# Patient Record
Sex: Female | Born: 1955 | Race: Black or African American | Hispanic: No | Smoking: Former smoker
Health system: Southern US, Community
[De-identification: ages and names within clinical notes are randomized; demographics above are authoritative.]

## PROBLEM LIST (undated history)

## (undated) ENCOUNTER — Ambulatory Visit: Admission: EM

## (undated) DIAGNOSIS — E785 Hyperlipidemia, unspecified: Secondary | ICD-10-CM

## (undated) DIAGNOSIS — K219 Gastro-esophageal reflux disease without esophagitis: Secondary | ICD-10-CM

## (undated) DIAGNOSIS — I10 Essential (primary) hypertension: Secondary | ICD-10-CM

## (undated) HISTORY — DX: Hyperlipidemia, unspecified: E78.5

---
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
## 1998-06-21 ENCOUNTER — Emergency Department (HOSPITAL_COMMUNITY): Admission: EM | Admit: 1998-06-21 | Discharge: 1998-06-22 | Payer: Self-pay | Admitting: Endocrinology

## 1998-06-24 ENCOUNTER — Ambulatory Visit (HOSPITAL_COMMUNITY): Admission: RE | Admit: 1998-06-24 | Discharge: 1998-06-24 | Payer: Self-pay | Admitting: Orthopedic Surgery

## 1999-01-29 ENCOUNTER — Encounter: Admission: RE | Admit: 1999-01-29 | Discharge: 1999-01-29 | Payer: Self-pay | Admitting: Family Medicine

## 2000-02-22 ENCOUNTER — Encounter: Admission: RE | Admit: 2000-02-22 | Discharge: 2000-02-22 | Payer: Self-pay | Admitting: Family Medicine

## 2000-03-07 ENCOUNTER — Encounter: Admission: RE | Admit: 2000-03-07 | Discharge: 2000-03-07 | Payer: Self-pay | Admitting: Family Medicine

## 2000-03-14 ENCOUNTER — Encounter: Admission: RE | Admit: 2000-03-14 | Discharge: 2000-03-14 | Payer: Self-pay | Admitting: Family Medicine

## 2000-04-29 ENCOUNTER — Encounter: Admission: RE | Admit: 2000-04-29 | Discharge: 2000-04-29 | Payer: Self-pay | Admitting: Family Medicine

## 2000-08-09 ENCOUNTER — Encounter: Payer: Self-pay | Admitting: Emergency Medicine

## 2000-08-09 ENCOUNTER — Emergency Department (HOSPITAL_COMMUNITY): Admission: EM | Admit: 2000-08-09 | Discharge: 2000-08-10 | Payer: Self-pay | Admitting: Emergency Medicine

## 2002-10-23 ENCOUNTER — Emergency Department (HOSPITAL_COMMUNITY): Admission: EM | Admit: 2002-10-23 | Discharge: 2002-10-23 | Payer: Self-pay | Admitting: Emergency Medicine

## 2002-10-23 ENCOUNTER — Encounter: Payer: Self-pay | Admitting: Emergency Medicine

## 2002-10-24 ENCOUNTER — Encounter: Admission: RE | Admit: 2002-10-24 | Discharge: 2002-10-24 | Payer: Self-pay | Admitting: Sports Medicine

## 2002-10-26 ENCOUNTER — Encounter: Admission: RE | Admit: 2002-10-26 | Discharge: 2002-10-26 | Payer: Self-pay | Admitting: Sports Medicine

## 2002-10-26 ENCOUNTER — Encounter: Payer: Self-pay | Admitting: Sports Medicine

## 2003-01-19 DIAGNOSIS — K219 Gastro-esophageal reflux disease without esophagitis: Secondary | ICD-10-CM

## 2003-01-19 HISTORY — DX: Gastro-esophageal reflux disease without esophagitis: K21.9

## 2003-01-28 ENCOUNTER — Encounter: Admission: RE | Admit: 2003-01-28 | Discharge: 2003-01-28 | Payer: Self-pay | Admitting: Family Medicine

## 2003-01-30 ENCOUNTER — Encounter: Admission: RE | Admit: 2003-01-30 | Discharge: 2003-01-30 | Payer: Self-pay | Admitting: Family Medicine

## 2003-05-13 ENCOUNTER — Emergency Department (HOSPITAL_COMMUNITY): Admission: EM | Admit: 2003-05-13 | Discharge: 2003-05-14 | Payer: Self-pay | Admitting: Emergency Medicine

## 2004-01-04 ENCOUNTER — Emergency Department (HOSPITAL_COMMUNITY): Admission: EM | Admit: 2004-01-04 | Discharge: 2004-01-05 | Payer: Self-pay | Admitting: Emergency Medicine

## 2004-05-23 ENCOUNTER — Encounter (INDEPENDENT_AMBULATORY_CARE_PROVIDER_SITE_OTHER): Payer: Self-pay | Admitting: *Deleted

## 2004-06-09 ENCOUNTER — Ambulatory Visit: Payer: Self-pay | Admitting: Family Medicine

## 2004-06-16 ENCOUNTER — Encounter: Admission: RE | Admit: 2004-06-16 | Discharge: 2004-06-16 | Payer: Self-pay | Admitting: Sports Medicine

## 2004-07-24 ENCOUNTER — Ambulatory Visit: Payer: Self-pay | Admitting: Family Medicine

## 2004-08-06 ENCOUNTER — Encounter: Admission: RE | Admit: 2004-08-06 | Discharge: 2004-08-06 | Payer: Self-pay | Admitting: Sports Medicine

## 2004-09-23 ENCOUNTER — Ambulatory Visit: Payer: Self-pay | Admitting: Family Medicine

## 2004-09-25 ENCOUNTER — Ambulatory Visit: Payer: Self-pay | Admitting: Family Medicine

## 2006-03-17 DIAGNOSIS — N951 Menopausal and female climacteric states: Secondary | ICD-10-CM | POA: Insufficient documentation

## 2006-03-17 DIAGNOSIS — E669 Obesity, unspecified: Secondary | ICD-10-CM

## 2006-03-17 DIAGNOSIS — K219 Gastro-esophageal reflux disease without esophagitis: Secondary | ICD-10-CM | POA: Insufficient documentation

## 2006-03-18 ENCOUNTER — Encounter (INDEPENDENT_AMBULATORY_CARE_PROVIDER_SITE_OTHER): Payer: Self-pay | Admitting: *Deleted

## 2006-06-06 ENCOUNTER — Emergency Department (HOSPITAL_COMMUNITY): Admission: EM | Admit: 2006-06-06 | Discharge: 2006-06-06 | Payer: Self-pay | Admitting: Family Medicine

## 2006-08-09 ENCOUNTER — Encounter (INDEPENDENT_AMBULATORY_CARE_PROVIDER_SITE_OTHER): Payer: Self-pay | Admitting: Family Medicine

## 2006-08-09 ENCOUNTER — Ambulatory Visit: Payer: Self-pay | Admitting: Family Medicine

## 2006-08-09 DIAGNOSIS — I10 Essential (primary) hypertension: Secondary | ICD-10-CM | POA: Insufficient documentation

## 2006-08-09 DIAGNOSIS — R42 Dizziness and giddiness: Secondary | ICD-10-CM

## 2006-08-10 ENCOUNTER — Encounter (INDEPENDENT_AMBULATORY_CARE_PROVIDER_SITE_OTHER): Payer: Self-pay | Admitting: Family Medicine

## 2006-08-12 ENCOUNTER — Encounter (INDEPENDENT_AMBULATORY_CARE_PROVIDER_SITE_OTHER): Payer: Self-pay | Admitting: Family Medicine

## 2007-03-18 ENCOUNTER — Emergency Department (HOSPITAL_COMMUNITY): Admission: EM | Admit: 2007-03-18 | Discharge: 2007-03-18 | Payer: Self-pay | Admitting: Family Medicine

## 2007-04-24 ENCOUNTER — Telehealth (INDEPENDENT_AMBULATORY_CARE_PROVIDER_SITE_OTHER): Payer: Self-pay | Admitting: Family Medicine

## 2007-05-05 ENCOUNTER — Ambulatory Visit: Payer: Self-pay | Admitting: Family Medicine

## 2007-12-27 ENCOUNTER — Emergency Department (HOSPITAL_COMMUNITY): Admission: EM | Admit: 2007-12-27 | Discharge: 2007-12-27 | Payer: Self-pay | Admitting: Emergency Medicine

## 2008-01-08 ENCOUNTER — Emergency Department (HOSPITAL_COMMUNITY): Admission: EM | Admit: 2008-01-08 | Discharge: 2008-01-08 | Payer: Self-pay | Admitting: Family Medicine

## 2008-02-25 ENCOUNTER — Emergency Department (HOSPITAL_COMMUNITY): Admission: EM | Admit: 2008-02-25 | Discharge: 2008-02-25 | Payer: Self-pay | Admitting: Emergency Medicine

## 2009-02-01 ENCOUNTER — Emergency Department (HOSPITAL_COMMUNITY): Admission: EM | Admit: 2009-02-01 | Discharge: 2009-02-01 | Payer: Self-pay | Admitting: Emergency Medicine

## 2009-03-21 ENCOUNTER — Telehealth: Payer: Self-pay | Admitting: Family Medicine

## 2009-08-25 ENCOUNTER — Ambulatory Visit: Payer: Self-pay | Admitting: Family Medicine

## 2009-08-25 ENCOUNTER — Encounter: Payer: Self-pay | Admitting: Family Medicine

## 2009-08-25 DIAGNOSIS — A5901 Trichomonal vulvovaginitis: Secondary | ICD-10-CM

## 2009-08-25 DIAGNOSIS — N926 Irregular menstruation, unspecified: Secondary | ICD-10-CM | POA: Insufficient documentation

## 2009-08-25 DIAGNOSIS — N760 Acute vaginitis: Secondary | ICD-10-CM | POA: Insufficient documentation

## 2009-08-25 DIAGNOSIS — N939 Abnormal uterine and vaginal bleeding, unspecified: Secondary | ICD-10-CM

## 2009-08-25 DIAGNOSIS — R350 Frequency of micturition: Secondary | ICD-10-CM

## 2009-08-25 LAB — CONVERTED CEMR LAB
ALT: 13 units/L (ref 0–35)
AST: 14 units/L (ref 0–37)
Alkaline Phosphatase: 102 units/L (ref 39–117)
BUN: 16 mg/dL (ref 6–23)
Bilirubin Urine: NEGATIVE
Calcium: 9.5 mg/dL (ref 8.4–10.5)
Creatinine, Ser: 0.88 mg/dL (ref 0.40–1.20)
GC Probe Amp, Genital: NEGATIVE
Glucose, Urine, Semiquant: NEGATIVE
HCT: 37.6 % (ref 36.0–46.0)
Hemoglobin: 12.3 g/dL (ref 12.0–15.0)
MCHC: 32.7 g/dL (ref 30.0–36.0)
MCV: 78.7 fL (ref 78.0–100.0)
Platelets: 353 10*3/uL (ref 150–400)
RDW: 15.1 % (ref 11.5–15.5)
Total Bilirubin: 0.3 mg/dL (ref 0.3–1.2)
Urobilinogen, UA: 0.2
pH: 5.5

## 2009-10-07 ENCOUNTER — Telehealth: Payer: Self-pay | Admitting: Family Medicine

## 2009-12-31 ENCOUNTER — Ambulatory Visit: Payer: Self-pay | Admitting: Family Medicine

## 2009-12-31 DIAGNOSIS — R609 Edema, unspecified: Secondary | ICD-10-CM | POA: Insufficient documentation

## 2009-12-31 DIAGNOSIS — G4730 Sleep apnea, unspecified: Secondary | ICD-10-CM

## 2010-01-07 ENCOUNTER — Ambulatory Visit: Payer: Self-pay

## 2010-01-18 DIAGNOSIS — I10 Essential (primary) hypertension: Secondary | ICD-10-CM

## 2010-01-18 DIAGNOSIS — E785 Hyperlipidemia, unspecified: Secondary | ICD-10-CM

## 2010-01-18 HISTORY — DX: Hyperlipidemia, unspecified: E78.5

## 2010-01-18 HISTORY — DX: Essential (primary) hypertension: I10

## 2010-01-28 ENCOUNTER — Ambulatory Visit: Admit: 2010-01-28 | Payer: Self-pay

## 2010-02-17 NOTE — Assessment & Plan Note (Signed)
Summary: female problem,tcb   Vital Signs:  Patient profile:   55 year old female Height:      67 inches Weight:      281 pounds BMI:     44.17 Pulse rate:   60 / minute BP sitting:   167 / 81  (left arm) Cuff size:   large  Vitals Entered By: Arlyss Repress CMA, (August 25, 2009 2:44 PM) CC: vaginal d/c x 3 weeks. had unprotected sex one month ago. Is Patient Diabetic? No Pain Assessment Patient in pain? no        CC:  vaginal d/c x 3 weeks. had unprotected sex one month ago.Marland Kitchen  History of Present Illness: Unprotected sexual intercourse one month ago, 3 weeks developed a brown vaginal discharge, and irritiation.  She called her partner to tell him and he stopped contact with her.  She is very frightened and fearful that there is more to the story.  She did not refuse HIV testing but wanted to not have it done because of fear. Occasional drinks beer, had 2 beers 2 days ago, denies illicit drugs.  She rarely come into the clinic, has been out of her BP meds.    Habits & Providers  Alcohol-Tobacco-Diet     Tobacco Status: quit  Current Medications (verified): 1)  Hydrochlorothiazide 25 Mg  Tabs (Hydrochlorothiazide) .... Take 1 Tab By Mouth Every Morning 2)  Metronidazole 500 Mg Tabs (Metronidazole) .... 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  Is dating a man steadily.  Social History: Smoking Status:  quit  Review of Systems      See HPI General:  Denies fever and  sweats. GU:  Complains of abnormal vaginal bleeding, discharge, dysuria, and urinary frequency; denies pelvic pain.  Physical Exam  General:  Obese, 55 year old AA female Genitalia:  normal introitus, thick copius yellow discharge, inflammed vaginal wall and cervix.  No CMT.  Wet mount + trich   Impression & Recommendations:  Problem # 1:  VAGINAL TRICHOMONIASIS (ICD-131.01) Complete STD testing with HIV adn GC/CT.  She was very upset and almost could not have blood work completed.  Metronidazole 2 GM dose, will call in the AM.  Problem # 2:  HYPERTENSION, BENIGN ESSENTIAL (ICD-401.1) labs today, return in 2 weeks to discuss medical problems has not been in for a few years. The following medications were removed from the medication list:    Hydrochlorothiazide 25 Mg Tabs (Hydrochlorothiazide) .Marland Kitchen... Take 1 tab by mouth every morning  Orders: Comp Met-[facility] ([id number])  Complete Medication List: 1)  Metronidazole 500 Mg Tabs (Metronidazole) .... 4 pills at one time  Other Orders: Wet Prep- [facility] [phone]) Urinalysis-[facility] (00000) CBC-[facility] (13086) TSH-[facility] [phone]) GC/Chlamydia-[facility] (87591/[id number]) HIV-[facility] (28413-24401) [facility]- Est  Level 4 (02725)  Patient Instructions: 1)  Please schedule a follow-up appointment in 2 weeks.  2)  [phone]-call tomorrow Prescriptions: METRONIDAZOLE 500 MG TABS (METRONIDAZOLE) 4 pills at one time  #4 x 0   Entered and Authorized by:  Luretha Murphy NP   Signed by:   Arlyss Repress CMA, on 08/25/2009   Method used:   Electronically to        [id number]* (retail)       [address] [place], [place]  [address]       Ph: [phone]       Fax: [phone]   RxID:   [id number]   Laboratory Results   Urine Tests  Date/Time Received: August 25, 2009 3:04 PM  Date/Time Reported: August 25, 2009 3:12 PM   Routine Urinalysis   Color: yellow Appearance: Clear Glucose: negative   (Normal Range: Negative) Bilirubin:  negative   (Normal Range: Negative) Ketone: negative   (Normal Range: Negative) Spec. Gravity: 1.015   (Normal Range: 1.003-1.035) Blood: small   (Normal Range: Negative) pH: 5.5   (Normal Range: 5.0-8.0) Protein: trace   (Normal Range: Negative) Urobilinogen: 0.2   (Normal Range: 0-1) Nitrite: negative   (Normal Range: Negative) Leukocyte Esterace: moderate   (Normal Range: Negative)  Urine Microscopic WBC/HPF: 5-10 RBC/HPF: 0-3 Bacteria/HPF: 1+ Epithelial/HPF: 1-5 Other: few trich    Comments: ...............test performed by......Marland KitchenBonnie A. [place], MLS (ASCP)cm  Date/Time Received: August 25, 2009 3:04 PM  Date/Time Reported: August 25, 2009 3:33 PM   [employer] Source: vag WBC/hpf: 10-20 Bacteria/hpf: 3+  Cocci Clue cells/hpf: none  Positive whiff Yeast/hpf: none Trichomonas/hpf: many Comments: ...............test performed by......Marland KitchenBonnie A. [place], MLS (ASCP)cm

## 2010-02-17 NOTE — Progress Notes (Signed)
Summary: resch  Phone Note Call from Patient   Caller: Patient Summary of Call: pt called to say she couldn't get off work today so she had to resch Initial call taken by: De Nurse,  March 21, 2009 1:45 PM

## 2010-02-17 NOTE — Progress Notes (Signed)
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

## 2010-02-18 ENCOUNTER — Inpatient Hospital Stay (HOSPITAL_COMMUNITY)
Admission: RE | Admit: 2010-02-18 | Discharge: 2010-02-18 | Disposition: A | Discharge status: Home / Self Care | Payer: Self-pay | Source: Ambulatory Visit | Attending: Emergency Medicine | Admitting: Emergency Medicine

## 2010-02-18 DIAGNOSIS — K047 Periapical abscess without sinus: Secondary | ICD-10-CM

## 2010-02-19 NOTE — Assessment & Plan Note (Signed)
Summary: retaining fluid/eo   Vital Signs:  Patient profile:   55 year old female Height:      67 inches Weight:      305 pounds BMI:     47.94 BSA:     2.42 Temp:     97.8 degrees F Pulse rate:   82 / minute BP sitting:   167 / 90  Vitals Entered By: Jone Baseman CMA (December 31, 2009 11:17 AM) CC: retaining fluid x 2 weeks Is Patient Diabetic? No Pain Assessment Patient in pain? no        Primary Care Provider:  . [facility]  CC:  retaining fluid x 2 weeks.  History of Present Illness: edema in legs, gets better with elevation.  off and on for years, worse in last 2 weeks.  no SOB, walking 3 miles daily, no CP.  does wake up at night with SOB but known to snore, has witnessed sleep aopnea.    weight gain since last visit.  was losing weight before wedding but since has separated and lost motivation.  recently restarted.  Habits & Providers  Alcohol-Tobacco-Diet     Tobacco Status: quit  Current Medications (verified): 1)  Hydrochlorothiazide 25 Mg Tabs (Hydrochlorothiazide) .... Take One By Mouth Q Day  Allergies (verified): No Known Drug Allergies  Review of Systems  The patient denies anorexia, fever, chest pain, syncope, dyspnea on exertion, prolonged cough, and abdominal pain.    Physical Exam  General:  VS reviewed.  obese Lungs:  Normal respiratory effort, chest expands symmetrically. Lungs are clear to auscultation, no crackles or wheezes. Heart:  Normal rate and regular rhythm. S1 and S2 normal without gallop, murmur, click, rub or other extra sounds. Abdomen:  Bowel sounds positive,abdomen soft and non-tender without masses, organomegaly or hernias noted. Extremities:  trace edema to shins   Impression & Recommendations:  Problem # 1:  HYPERTENSION, BENIGN ESSENTIAL (ICD-401.1) Assessment Deteriorated willl restart HCTZ. RN visit 1 week for labs, recheck BP Her updated medication list for this problem includes:    Hydrochlorothiazide 25  Mg Tabs (Hydrochlorothiazide) .Marland Kitchen... Take one by mouth q day  Orders: [facility]- Est  Level 4 ([address]214)Future Orders: Basic Met-[facility] ([id number]) ... 12/30/2010  Problem # 2:  SLEEP APNEA (ICD-780.57) Assessment: New sleep apnea.  likely needs CPAP.  send for sleep study Orders: [facility]- Est  Level 4 (99214)Future Orders: Diagnostic Polysomnogram (Diagnostic Polysomno) ... 12/23/2010  Problem # 3:  LEG EDEMA, BILATERAL (ICD-782.3) Assessment: Deteriorated will start HCTZ.  echo to eval heart function with her worsening leg edema.  will work on losing weight, exercise for her heart.  consider starting ASA in the future.   Her updated medication list for this problem includes:    Hydrochlorothiazide 25 Mg Tabs (Hydrochlorothiazide) .Marland Kitchen... Take one by mouth q day  Orders: [facility]- Est  Level 4 ([address]214)Future Orders: 2 D Echo (2 D Echo) ... 01/08/2011  Complete Medication List: 1)  Hydrochlorothiazide 25 Mg Tabs (Hydrochlorothiazide) .... Take one by mouth q day  Other Orders: Future Orders: Lipid-[facility] ([id number]) ... 01/07/2011  Patient Instructions: 1)  RN visit and Lab appt 1 week 2)  MD visit 1 month 3)  It was nice to meet you today.  4)  Congratulations on getting back into better diet and fitness! 5)  Once you get [facility] qualified, we will do your testing.  I think right now we are doing what we need to do to keep you healthy.   6)  We will start the HCTZ and you will return in one week for a nurse visit for a blood pressure check.  See me in one month 7)  When you come for the nurse visit, you can also go to the lab for a cholesterol check.   Prescriptions: HYDROCHLOROTHIAZIDE 25 MG TABS (HYDROCHLOROTHIAZIDE) take one by mouth q day  #31 x 6   Entered and Authorized by:   Ellery Plunk MD   Signed by:   Ellery Plunk MD on 12/31/2009   Method used:   Electronically to        [facility] [address] #[id number]* (retail)       [address] [place], [place]  [address]       Ph:  [phone]       Fax: [phone]   RxID:   [id number]    Orders Added: 1)  Basic Met[facility] [phone] 2)  Lipid-[facility] [[id number]] 3)  2 D Echo [2 D Echo] 4)  Diagnostic Polysomnogram [Diagnostic Polysomno] 5)  [facility]- Est  Level 4 [34742]

## 2010-05-13 ENCOUNTER — Observation Stay (HOSPITAL_COMMUNITY)
Admission: EM | Admit: 2010-05-13 | Discharge: 2010-05-14 | Disposition: A | Discharge status: Home / Self Care | Payer: Self-pay | Attending: Emergency Medicine | Admitting: Emergency Medicine

## 2010-05-13 ENCOUNTER — Inpatient Hospital Stay (INDEPENDENT_AMBULATORY_CARE_PROVIDER_SITE_OTHER)
Admission: RE | Admit: 2010-05-13 | Discharge: 2010-05-13 | Disposition: A | Discharge status: Home / Self Care | Payer: Self-pay | Source: Ambulatory Visit | Attending: Emergency Medicine | Admitting: Emergency Medicine

## 2010-05-13 DIAGNOSIS — T46905A Adverse effect of unspecified agents primarily affecting the cardiovascular system, initial encounter: Secondary | ICD-10-CM | POA: Insufficient documentation

## 2010-05-13 DIAGNOSIS — T783XXA Angioneurotic edema, initial encounter: Principal | ICD-10-CM | POA: Insufficient documentation

## 2010-05-13 DIAGNOSIS — T50995A Adverse effect of other drugs, medicaments and biological substances, initial encounter: Secondary | ICD-10-CM

## 2010-05-13 DIAGNOSIS — I10 Essential (primary) hypertension: Secondary | ICD-10-CM

## 2010-05-13 DIAGNOSIS — Y92009 Unspecified place in unspecified non-institutional (private) residence as the place of occurrence of the external cause: Secondary | ICD-10-CM | POA: Insufficient documentation

## 2010-08-01 ENCOUNTER — Other Ambulatory Visit: Payer: Self-pay | Admitting: Family Medicine

## 2010-09-27 ENCOUNTER — Other Ambulatory Visit: Payer: Self-pay | Admitting: Family Medicine

## 2010-09-27 DIAGNOSIS — I10 Essential (primary) hypertension: Secondary | ICD-10-CM

## 2010-12-11 ENCOUNTER — Encounter: Payer: Self-pay | Admitting: *Deleted

## 2010-12-11 ENCOUNTER — Emergency Department (HOSPITAL_COMMUNITY)
Admission: EM | Admit: 2010-12-11 | Discharge: 2010-12-11 | Disposition: A | Discharge status: Home / Self Care | Payer: Self-pay | Attending: Emergency Medicine | Admitting: Emergency Medicine

## 2010-12-11 DIAGNOSIS — K219 Gastro-esophageal reflux disease without esophagitis: Secondary | ICD-10-CM | POA: Insufficient documentation

## 2010-12-11 DIAGNOSIS — Z79899 Other long term (current) drug therapy: Secondary | ICD-10-CM | POA: Insufficient documentation

## 2010-12-11 DIAGNOSIS — I10 Essential (primary) hypertension: Secondary | ICD-10-CM | POA: Insufficient documentation

## 2010-12-11 DIAGNOSIS — K089 Disorder of teeth and supporting structures, unspecified: Secondary | ICD-10-CM | POA: Insufficient documentation

## 2010-12-11 DIAGNOSIS — K047 Periapical abscess without sinus: Secondary | ICD-10-CM

## 2010-12-11 HISTORY — DX: Essential (primary) hypertension: I10

## 2010-12-11 HISTORY — DX: Gastro-esophageal reflux disease without esophagitis: K21.9

## 2010-12-11 MED ORDER — PENICILLIN V POTASSIUM 500 MG PO TABS: 500.0000 mg | ORAL_TABLET | Freq: Four times a day (QID) | ORAL | Status: AC

## 2010-12-11 MED ORDER — BUPIVACAINE-EPINEPHRINE PF 0.5-1:200000 % IJ SOLN
INTRAMUSCULAR | Status: AC
  Filled 2010-12-11: qty 1.8

## 2010-12-11 MED ORDER — HYDROCODONE-ACETAMINOPHEN 7.5-500 MG/15ML PO SOLN: 7.5000 mL | Freq: Four times a day (QID) | ORAL | Status: AC | PRN

## 2010-12-11 NOTE — ED Notes (Signed)
Pt in c/o toothache to right upper jaw x3 days

## 2010-12-11 NOTE — ED Provider Notes (Signed)
History     CSN: [id number] Arrival date & time: 12/11/2010  4:47 AM   First MD Initiated Contact with Patient 12/11/10 0449      Chief Complaint  Patient presents with  . Dental Pain    (Consider location/radiation/quality/duration/timing/severity/associated sxs/prior treatment) Patient is a 55 y.o. female presenting with tooth pain. The history is provided by the patient.  Dental PainThe primary symptoms include mouth pain. Primary symptoms do not include headaches, fever or shortness of breath. The symptoms began 3 to 5 days ago. The symptoms are worsening. The symptoms occur constantly.  Additional symptoms include: jaw pain and facial swelling. Additional symptoms do not include: dental sensitivity to temperature, gum swelling, gum tenderness, trismus, trouble swallowing, excessive salivation, drooling, ear pain, hearing loss and nosebleeds. Medical issues do not include: smoking.   has a dentist but has not made an appointment to be evaluated. No fevers or vomiting. No shortness of breath. No trauma. Moderate in severity. Sharp in quality. No radiation of pain. No alleviating factors. Motrin at home without relief.  Past Medical History  Diagnosis Date  . Hypertension   . GERD (gastroesophageal reflux disease)     History reviewed. No pertinent past surgical history.  History reviewed. No pertinent family history.  History  Substance Use Topics  . Smoking status: Never Smoker   . Smokeless tobacco: Not on file  . Alcohol Use: No    OB History    Grav Para Term Preterm Abortions TAB SAB Ect Mult Living                  Review of Systems  Constitutional: Negative for fever and chills.  HENT: Positive for facial swelling and dental problem. Negative for hearing loss, ear pain, nosebleeds, drooling, trouble swallowing, neck pain and neck stiffness.   Eyes: Negative for pain.  Respiratory: Negative for shortness of breath.   Cardiovascular: Positive for chest pain.    Gastrointestinal: Negative for abdominal pain.  Genitourinary: Negative for dysuria.  Musculoskeletal: Negative for back pain.  Skin: Negative for rash.  Neurological: Negative for headaches.  All other systems reviewed and are negative.    Allergies  Review of patient's allergies indicates no known allergies.  Home Medications   Current Outpatient Rx  Name Route Sig Dispense Refill  . HYDROCHLOROTHIAZIDE 25 MG PO TABS  TAKE 1 TABLET BY MOUTH EVERY DAY 14 tablet 0    Patient needs appointment for additional refills.    BP 173/81  Pulse 93  Temp(Src) 98.1 F (36.7 C) (Oral)  Resp 20  SpO2 100%  Physical Exam  Constitutional: She is oriented to person, place, and time. She appears well-developed and well-nourished.  HENT:  Head: Normocephalic and atraumatic.       Uvula midline. No trismus. Poor dentition. Right upper premolar tenderness to palpation with no associated gingival fluctuance or swelling appreciated. No facial swelling or erythema.  Eyes: Conjunctivae and EOM are normal. Pupils are equal, round, and reactive to light.  Neck: Trachea normal. Neck supple. No thyromegaly present.  Cardiovascular: Normal rate, regular rhythm, S1 normal, S2 normal and normal pulses.     No systolic murmur is present   No diastolic murmur is present  Pulses:      Radial pulses are 2+ on the right side, and 2+ on the left side.  Pulmonary/Chest: Effort normal and breath sounds normal. She has no wheezes. She has no rhonchi. She has no rales. She exhibits no tenderness.  Abdominal: Soft. Normal  appearance and bowel sounds are normal. There is no tenderness. There is no CVA tenderness and negative Murphy's sign.  Musculoskeletal:       BLE:s Calves nontender, no cords or erythema, negative Homans sign  Neurological: She is alert and oriented to person, place, and time. She has normal strength. No cranial nerve deficit or sensory deficit. GCS eye subscore is 4. GCS verbal subscore is 5.  GCS motor subscore is 6.  Skin: Skin is warm and dry. No rash noted. She is not diaphoretic.  Psychiatric: Her speech is normal.       Cooperative and appropriate    ED Course  Procedures (including critical care time)  Dental block offered and patient declines. Prescriptions provided with recommendations to followup with dentist.   MDM   Dental pain likely abscess. Antibiotics and pain medications provided        Sunnie Nielsen, MD 12/11/10 [phone]

## 2011-12-29 ENCOUNTER — Encounter: Payer: Self-pay | Admitting: Family Medicine

## 2011-12-29 ENCOUNTER — Ambulatory Visit (INDEPENDENT_AMBULATORY_CARE_PROVIDER_SITE_OTHER): Payer: Self-pay | Admitting: Family Medicine

## 2011-12-29 VITALS — BP 112/72 | HR 76 | Temp 98.8°F | Ht 68.0 in | Wt 334.0 lb

## 2011-12-29 DIAGNOSIS — R350 Frequency of micturition: Secondary | ICD-10-CM

## 2011-12-29 DIAGNOSIS — I10 Essential (primary) hypertension: Secondary | ICD-10-CM

## 2011-12-29 DIAGNOSIS — E669 Obesity, unspecified: Secondary | ICD-10-CM

## 2011-12-29 DIAGNOSIS — R609 Edema, unspecified: Secondary | ICD-10-CM

## 2011-12-29 MED ORDER — ATENOLOL-CHLORTHALIDONE 100-25 MG PO TABS: 1.0000 | ORAL_TABLET | Freq: Every day | ORAL | Status: DC

## 2011-12-29 MED ORDER — AMLODIPINE BESYLATE 10 MG PO TABS: 10.0000 mg | ORAL_TABLET | Freq: Every day | ORAL | Status: DC

## 2011-12-29 NOTE — Assessment & Plan Note (Signed)
A: well controlled. Meds: compliant P: continue current therapy. RF made.

## 2011-12-29 NOTE — Progress Notes (Signed)
Subjective:     Patient ID: Regina Burns, female   DOB: [birth year], 56 y.o.   MRN: [id number]  HPI 56 yo F new patient presents to establish primary care and to discuss the following:  1. HTN: compliant with medications. Denies HA, CP, SOB. Has LE edema.   2. Frequent urination: ongoing for years. Urinated 4 x during the day and 4x during the night. Incontinent at night. Has urgency. Denies dysuria. Drinks caffeine and carbonated beverages. Gaining weight.   3. Health maintenance: due for flu shot, mammogram and pap smear.   Meds: not taking statin. Increased muscle cramps while taking it. Decreased muscle cramps now that she is not taking it.   Review of Systems Patient Information Form: Screening and ROS  AUDIT-C Score: 2 Do you feel safe in relationships? yes PHQ-2:negative  Review of Symptoms  General:  Negative for nexplained weight loss, fever Skin: Negative for new or changing mole, sore that won't heal HEENT: Positive for trouble seeing and ringing in the ears. Negative for trouble hearing, mouth sores, hoarseness, change in voice, dysphagia. CV:  Positive for feet swelling. Negative for chest pain, dyspnea, palpitations Resp: Negative for cough, dyspnea, hemoptysis GI: Negative for nausea, vomiting, diarrhea, constipation, abdominal pain, melena, hematochezia. GU: Positive for frequent urination and incontinence. Negative for dysuria, urinary hesitance, hematuria, vaginal or penile discharge, polyuria, sexual difficulty, lumps in testicle or breasts MSK: Negative for muscle cramps or aches, joint pain or swelling Neuro: Positive for dizziness.Negative for headaches, weakness, numbness, passing out/fainting Psych: Negative for depression, anxiety, memory problems    Objective:   Physical Exam BP 112/72  Pulse 76  Temp 98.8 F (37.1 C) (Oral)  Ht 5\' 8"  (1.727 m)  Wt 334 lb (151.501 kg)  BMI 50.78 kg/m2 General appearance: alert, cooperative, no distress and  morbidly obese Head: Normocephalic, without obvious abnormality, atraumatic Eyes: conjunctivae/corneas clear. PERRL, EOM's intact. Fundi benign. Ears: normal TM's and external ear canals both ears Nose: Nares normal. Septum midline. Mucosa normal. No drainage or sinus tenderness. Throat: lips, mucosa, and tongue normal; teeth and gums normal Neck: no adenopathy, no carotid bruit, no JVD, supple, symmetrical, trachea midline and thyroid not enlarged, symmetric, no tenderness/mass/nodules Lungs: clear to auscultation bilaterally Heart: regular rate and rhythm, S1, S2 normal, no murmur, click, rub or gallop Abdomen: obese soft, non-tender; bowel sounds normal; no masses,  no organomegaly Extremities: edema 1 + bilateral.  Neurologic: Grossly normal     Assessment and Plan:

## 2011-12-29 NOTE — Assessment & Plan Note (Signed)
Has been successful with walking program in past (90 lb wt loss in 2009). Plans to restart walking program.

## 2011-12-29 NOTE — Assessment & Plan Note (Signed)
For urinary frequency: you have stress and urge  incontinence. There are medications available to try to treat this. First try the following lifestyle changes.   1. Weight loss to take stress off pelvic floor.  2. kegels to strengthen pelvic floor.  3.  Do not drink before bedtime.  4. Avoid Alcoholic beverages Carbonated beverages (with or without caffeine) Coffee or tea (with or without caffeine) Other food groups mentioned in the literature but without supporting evidence include: citrus juice and fruits, tomatoes and tomato-based products, spicy foods, artificial sweetener, chocolate, corn syrup, sugar or honey 5. Take to trip to urinate every 3-4 hrs even if you can hold it.

## 2011-12-29 NOTE — Patient Instructions (Addendum)
Mrs. Lacerda,   Thank you for coming in today.   Please continue your blood pressure medications.   For urinary frequency: you have stress and urge  incontinence. There are medications available to try to treat this. First try the following lifestyle changes.   1. Weight loss to take stress off pelvic floor.  2. kegels to strengthen pelvic floor.  3.  Do not drink before bedtime.  4. Avoid Alcoholic beverages Carbonated beverages (with or without caffeine) Coffee or tea (with or without caffeine) Other food groups mentioned in the literature but without supporting evidence include: citrus juice and fruits, tomatoes and tomato-based products, spicy foods, artificial sweetener, chocolate, corn syrup, sugar or honey 5. Take to trip to urinate every 3-4 hrs even if you can hold it.   Enjoy your walking exercise. I look forward to you telling me how it goes.   F/u in 4 weeks after you meet with Andria Meuse for physical.   Dr. Armen Pickup

## 2011-12-29 NOTE — Assessment & Plan Note (Signed)
A: 2/2 obesity and norvasc.  P: wt loss.

## 2012-05-09 ENCOUNTER — Telehealth: Payer: Self-pay | Admitting: Family Medicine

## 2012-05-09 NOTE — Telephone Encounter (Signed)
Patient is feeling kind of weak with a slight headache and ringing in her ears that she feels might be from her blood pressure and isn't sure if she needs to be seen so she would like to speak to the nurse.

## 2012-05-09 NOTE — Telephone Encounter (Signed)
Previous [facility] patient and was amlodipine and atenolol.  Was told that her BP med "was too strong because my BP was too low."  Patient states felt "dizzy, had headache, and weak."  Had BP checked and 110/72.  Has been having "ringing in ears" and not sure if it is related to the dizziness.  Had an appt for orange card, but our office called to cancel it because patient needed to sign up for "Obamacare."  Patient is out of work now and has not been able to get signed up because she does not have the money to send in the paperwork.  Last office visit in 12/2011.  Informed patient she will need office visit to discuss BP meds.  Patient concerned because she doesn't have money for an office visit.  Will route note to [facility] to check on eligibility status.  Also, routed note to Dr. Armen Pickup for advice with BP med & dizziness.  Gaylene Brooks, RN

## 2012-05-10 NOTE — Telephone Encounter (Signed)
Called patient. She is feeling better today. Was lightheaded with headache yesterday. Feels fine today.   Has ringing in ears. Does not feel dizzy.   Advised: Cut norvasc in half to 5 mg daily F/u with me in two weeks. Will eval ringing in ear at this time.

## 2012-06-28 ENCOUNTER — Other Ambulatory Visit: Payer: Self-pay | Admitting: Family Medicine

## 2012-07-03 ENCOUNTER — Other Ambulatory Visit: Payer: Self-pay | Admitting: Family Medicine

## 2012-10-07 ENCOUNTER — Other Ambulatory Visit: Payer: Self-pay | Admitting: Family Medicine

## 2012-10-11 ENCOUNTER — Telehealth: Payer: Self-pay | Admitting: Family Medicine

## 2012-10-11 NOTE — Telephone Encounter (Signed)
Pt called because the pharmacy told her she was out of her refills of atenolol. She would like [place] to send in a refill. JW

## 2012-10-16 ENCOUNTER — Other Ambulatory Visit: Payer: Self-pay | Admitting: Family Medicine

## 2012-10-16 MED ORDER — ATENOLOL-CHLORTHALIDONE 100-25 MG PO TABS: ORAL_TABLET | ORAL | Status: DC

## 2012-10-16 NOTE — Addendum Note (Signed)
Addended by: Dessa Phi on: 10/16/2012 08:21 AM   Modules accepted: Orders

## 2012-10-16 NOTE — Telephone Encounter (Signed)
Refill sent in

## 2013-01-06 ENCOUNTER — Other Ambulatory Visit: Payer: Self-pay | Admitting: Family Medicine

## 2013-01-13 ENCOUNTER — Other Ambulatory Visit: Payer: Self-pay | Admitting: Family Medicine

## 2013-03-09 ENCOUNTER — Other Ambulatory Visit: Payer: Self-pay | Admitting: Family Medicine

## 2013-04-09 ENCOUNTER — Other Ambulatory Visit: Payer: Self-pay | Admitting: Family Medicine

## 2013-05-07 ENCOUNTER — Other Ambulatory Visit: Payer: Self-pay | Admitting: Family Medicine

## 2013-06-05 ENCOUNTER — Other Ambulatory Visit: Payer: Self-pay | Admitting: Family Medicine

## 2013-07-15 ENCOUNTER — Other Ambulatory Visit: Payer: Self-pay | Admitting: Family Medicine

## 2013-08-02 ENCOUNTER — Other Ambulatory Visit: Payer: Self-pay | Admitting: Family Medicine

## 2013-08-02 NOTE — Telephone Encounter (Signed)
Left voice message for pt informing her that amlodipine has been refilled for 30 days, but she will need to schedule an appt for future refills.  Derl Barrow, RN

## 2013-08-02 NOTE — Telephone Encounter (Signed)
I will put in a refill for her amlodipine for 30 days, but please have her schedule an appointment with me! She hasn't been to see [place] since 2013! Thanks for your help!

## 2013-08-22 ENCOUNTER — Other Ambulatory Visit: Payer: Self-pay | Admitting: Family Medicine

## 2013-08-22 NOTE — Telephone Encounter (Signed)
I refilled enough for one month.  Please have her schedule an office visit.  She has not been seen since 2013.

## 2013-08-22 NOTE — Telephone Encounter (Signed)
Left message on patients voicemail.Adaley Kiene, Lewie Loron

## 2013-10-02 ENCOUNTER — Other Ambulatory Visit: Payer: Self-pay | Admitting: Family Medicine

## 2013-10-02 NOTE — Telephone Encounter (Signed)
I have refilled her BP medications. She has not been seen since 2013.  Please let her know that I cannot continue to fill her medications if she is not seen in the clinic and to schedule an appointment.  Thanks!

## 2013-10-04 NOTE — Telephone Encounter (Signed)
Patient has been informed.

## 2013-11-16 ENCOUNTER — Other Ambulatory Visit: Payer: Self-pay | Admitting: Family Medicine

## 2013-11-16 ENCOUNTER — Ambulatory Visit (INDEPENDENT_AMBULATORY_CARE_PROVIDER_SITE_OTHER): Payer: Self-pay | Admitting: Family Medicine

## 2013-11-16 ENCOUNTER — Encounter: Payer: Self-pay | Admitting: Family Medicine

## 2013-11-16 VITALS — BP 165/76 | HR 76 | Temp 97.8°F | Ht 68.0 in | Wt 318.1 lb

## 2013-11-16 DIAGNOSIS — I10 Essential (primary) hypertension: Secondary | ICD-10-CM

## 2013-11-16 LAB — COMPLETE METABOLIC PANEL WITH GFR
ALBUMIN: 3.7 g/dL (ref 3.5–5.2)
ALK PHOS: 90 U/L (ref 39–117)
ALT: 18 U/L (ref 0–35)
AST: 19 U/L (ref 0–37)
BUN: 12 mg/dL (ref 6–23)
CO2: 25 meq/L (ref 19–32)
Calcium: 8.8 mg/dL (ref 8.4–10.5)
Chloride: 108 mEq/L (ref 96–112)
Creat: 0.8 mg/dL (ref 0.50–1.10)
GFR, EST NON AFRICAN AMERICAN: 82 mL/min
GFR, Est African American: >89 mL/min
GLUCOSE: 101 mg/dL — AB (ref 70–99)
POTASSIUM: 3.9 meq/L (ref 3.5–5.3)
SODIUM: 141 meq/L (ref 135–145)
TOTAL PROTEIN: 7.2 g/dL (ref 6.0–8.3)
Total Bilirubin: 0.3 mg/dL (ref 0.2–1.2)

## 2013-11-16 LAB — LIPID PANEL
Cholesterol: 219 mg/dL — ABNORMAL HIGH (ref 0–200)
HDL: 55 mg/dL (ref >39)
LDL Cholesterol: 148 mg/dL — ABNORMAL HIGH (ref 0–99)
Total CHOL/HDL Ratio: 4 Ratio
Triglycerides: 81 mg/dL (ref <150)
VLDL: 16 mg/dL (ref 0–40)

## 2013-11-16 MED ORDER — LOSARTAN POTASSIUM 50 MG PO TABS: 50.0000 mg | ORAL_TABLET | Freq: Every day | ORAL | Status: DC

## 2013-11-16 NOTE — Assessment & Plan Note (Addendum)
-   Initial check of 165/76 today.  Recheck showed BP of 152/74. 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  Call office if angioedema develops or present to ED/call 911 if she has trouble breathing. - BMP checked today.  Last checked in 2011. Monitor kidneys and liver with chronic medication use. - Lipid panel checked today.  Will calculate ASCVD risk once obtained.    -Had cramping with Pravastatin. Consider Lovastatin if statin is needed (on $4 [facility] list). - Return in 2 weeks for nursing to check BP.  Return for office visit in 4 weeks.

## 2013-11-16 NOTE — Patient Instructions (Signed)
Thank you so much for coming to see me today! Your blood pressure is still elevated.  I have sent in a prescription of Losartan for you to try.  It rarely causes an allergic reaction in those allergic to ACE Inhibitors, so please keep a close eye out for swelling and give [place] a call if this occurs! We will get some labwork today to see how your cholesterol is and to check your liver and kidneys! I will contact you with the results if we need to start something for your cholesterol! Please continue to work on your diet and exercise. Set small goals for yourself everyday and I believe you can accomplish them! Please return in 2 weeks for a nursing visit to check your blood pressure and in 4 weeks for a blood pressure evaluation with me or another physician.  Thanks again! Dr. Gerlean Ren

## 2013-11-16 NOTE — Progress Notes (Signed)
Reviewed

## 2013-11-16 NOTE — Telephone Encounter (Signed)
Also, patient states that the losartan is $150 for a 90 day supply. Requesting for a cheaper alternative.

## 2013-11-16 NOTE — Progress Notes (Signed)
Subjective:     Patient ID: Regina Burns, female   DOB: July 25, 1955, 58 y.o.   MRN: [id number]  HPI [name] is a 58yo female presenting today for blood pressure check. - Currently on Amlodipine and Atenolol-Chlorthalidone - Allergic to ACE Inhibitors- states her lips started swelling once starting it - States it is difficult for her to exercise because she has to watch her mother, who has Alzheimer's and will wonder off if not monitored  -Enjoyed walking with friends prior to mother living with her - Diet is poor. States that she snacks on whatever is home all day  - Last office visit in December 2013 - Has history of high cholesterol.  States she used to be on Pravastatin, but it caused muscle cramps so it was stopped.  - Interested in rechecking her cholesterol today - States she does get short of breath when walking  -Denies chest pain  - Denies edema or orthopnea  -Believes it is due to deconditioning and obesity - No further complaints  Review of Systems  Respiratory: Positive for shortness of breath.   Cardiovascular: Negative for chest pain.  All other systems reviewed and are negative.     Objective:   Physical Exam  Constitutional: She is oriented to person, place, and time. She appears well-developed and well-nourished. No distress.  HENT:  Head: Normocephalic and atraumatic.  Cardiovascular: Normal rate, regular rhythm and normal heart sounds.  Exam reveals no gallop and no friction rub.   No murmur heard. Pulmonary/Chest: Effort normal and breath sounds normal. No respiratory distress. She has no wheezes. She has no rales.  Abdominal: Soft. She exhibits no distension. There is no tenderness.  Musculoskeletal: She exhibits no edema.  Neurological: She is alert and oriented to person, place, and time.  Psychiatric: She has a normal mood and affect. Her behavior is normal.      Assessment:     Please refer to Problem List for Assessment.       Plan:     Please refer to Problem List for Plan.

## 2013-11-21 ENCOUNTER — Encounter: Payer: Self-pay | Admitting: Family Medicine

## 2013-11-21 MED ORDER — LOVASTATIN 20 MG PO TABS: 40.0000 mg | ORAL_TABLET | Freq: Every day | ORAL | Status: DC

## 2013-11-21 NOTE — Addendum Note (Signed)
Addended by: Lorna Few on: 11/21/2013 02:52 PM   Modules accepted: Orders

## 2013-12-03 ENCOUNTER — Ambulatory Visit (INDEPENDENT_AMBULATORY_CARE_PROVIDER_SITE_OTHER): Payer: Self-pay | Admitting: *Deleted

## 2013-12-03 VITALS — BP 138/82 | HR 88

## 2013-12-03 DIAGNOSIS — I10 Essential (primary) hypertension: Secondary | ICD-10-CM

## 2013-12-03 NOTE — Progress Notes (Signed)
Patient in today for blood pressure check. Blood pressure today was taken manually in the left arm with a large cuff and was 138/82. Patient report being unable to afford the losartin but has been taking amlodipine, atenolol, and lovastatin. Will forward note to PCP.

## 2013-12-11 ENCOUNTER — Ambulatory Visit: Payer: Self-pay | Admitting: Family Medicine

## 2013-12-14 ENCOUNTER — Other Ambulatory Visit: Payer: Self-pay | Admitting: Family Medicine

## 2013-12-25 ENCOUNTER — Encounter: Payer: Self-pay | Admitting: Family Medicine

## 2013-12-25 ENCOUNTER — Ambulatory Visit (INDEPENDENT_AMBULATORY_CARE_PROVIDER_SITE_OTHER): Payer: Self-pay | Admitting: Family Medicine

## 2013-12-25 VITALS — BP 137/71 | HR 67 | Temp 98.0°F | Ht 68.0 in | Wt 311.5 lb

## 2013-12-25 DIAGNOSIS — Z636 Dependent relative needing care at home: Secondary | ICD-10-CM

## 2013-12-25 DIAGNOSIS — I10 Essential (primary) hypertension: Secondary | ICD-10-CM

## 2013-12-25 NOTE — Assessment & Plan Note (Signed)
-   Has been taking care of her mother with Alzheimers alone. Has been feeling more anxious and "on edge." Considering SNF placement, however [facility] is reluctant at this time. - PHQ9 and GAD7 given. PHQ9 score of 4 and GAD 7 score of 4. - Caregiver Stress Self-Assessment Given: Score of 42, indicating moderate/severe stress - No medications indicated at this time - Agreed to meeting with social work for discussion on coping strategies and support groups. - Will mail letter for Regina Burns to take to [facility]

## 2013-12-25 NOTE — Assessment & Plan Note (Signed)
-   BP 157/74 today. Improved to 137/71 on recheck. - Continue current medications: Amlodipine-Chlorthalidone 100-25mg  and Amlodipine 10mg  - Will not continue Losartan. BP controlled today and Losartan is $150, which is more than she can afford at this time. - Continue to recommend working on diet and exercise. Exercise difficult for her with caring for mother. - May check her blood pressure periodically when in a drug store or [place].

## 2013-12-25 NOTE — Patient Instructions (Signed)
Thank you so much for coming to visit me today!  Your blood pressure looked much better when we rechecked it!  We will continue the Amlodipine and Atenolol-Chlorthalidone that you are taking.   I do not recommend medications for your stress at this time.  I will write a letter for you to take to [facility] and mail it to you in the next few days.  I will also have social work come by and speak with you.  Please let me know if there's anything else I can do! Dr. Gerlean Ren

## 2013-12-25 NOTE — Progress Notes (Signed)
Subjective:     Patient ID: Regina Burns, female   DOB: 16-Mar-1955, 58 y.o.   MRN: [id number]  HPI Mrs. Kobel is a 58yo female presenting today for blood pressure follow up.  # HTN - BP noted to be 165/76 at last visit on 11/16/13. Recheck showed BP of 152/74. - Returned for a nursing visit on 12/03/13. BP was 138/82 at this visit. - Currently taking Amlodipine-Chlorthalidone 100-25mg  and Amlodipine 10mg  - Was prescribed Losartan 50mg  at last visit. States it cost $150 and she was unable to fill the prescription.  Has been taking the above BP medications as prescribed. - States she has been under a lot of stress recently taking care of her mother with Alzheimer's and that she believes this is contributing significantly to her elevated BP. Has been unable to exercise effectively because she cannot leave her mother alone.  # Caregiver Stress - States she is the only caregiver of her mother, who has Alzheimer's - Mother is becoming hard for her to handle on her own. Has began considering SNF placement. Has discussed this with [facility] who are reluctant to place in SNF. - States she has been feeling anxious and "on edge" recently - Has not had any problems sleeping or decreased in interests. - Is not interested in medications at this time. Would like support and assistance with placing mother in SNF.   Review of Systems  Psychiatric/Behavioral: The patient is nervous/anxious.   All other systems reviewed and are negative.   Social history reviewed.    Objective:   Physical Exam  Constitutional: She appears well-developed and well-nourished. No distress.  Cardiovascular: Normal rate and regular rhythm.  Exam reveals no gallop and no friction rub.   No murmur heard. Pulmonary/Chest: Effort normal. No respiratory distress. She has no wheezes. She has no rales.  Musculoskeletal: She exhibits no edema.      Assessment:     Please refer to Problem List for Assessment.      Plan:     Please refer to Problem List for Plan.

## 2013-12-26 ENCOUNTER — Encounter: Payer: Self-pay | Admitting: Clinical

## 2013-12-26 NOTE — Progress Notes (Signed)
CSW met with pt who expressed caregiver burn out. CSW provided active listening as pt expressed how difficult it has been caring for her mother with dementia. Pt states she is not receiving any support from her sisters and is feeling somewhat depressed about not being able to work or have time for herself. Pt did share that her mother is a [facility] participant and is there M-F 8-5p. Pt's mother also just recently started receiving aide services M-TR from 5:30-8:30p. Pt states she appreciates the services however feels that a nursing facility might be something to consider in the near future. CSW praised pt for how much she does for her mother and validated her feelings. CSW also provided pt with a list of caregiver support groups that pt could benefit from. CSW also encouraged pt to think about positives in her life, pt smiled and shared that her mother is very funny & that she does enjoy her very much.  Pt reported at the end of the session feeling better. Pt agrees to attend a caregiver support meeting soon. CSW encouraged pt to call CSW if any other concerns or needs arise. Pt appreciative of support.  Hunt Oris, MSW, [place]

## 2014-01-04 ENCOUNTER — Telehealth: Payer: Self-pay | Admitting: Family Medicine

## 2014-01-04 ENCOUNTER — Encounter: Payer: Self-pay | Admitting: Family Medicine

## 2014-01-04 NOTE — Telephone Encounter (Signed)
Pt called and was checking on the status of the letter she had asked Dr. Gerlean Ren to write. Please call patient to discuss. jw

## 2014-01-07 NOTE — Telephone Encounter (Signed)
Spoke with patient and informed her that I have mailed out copy of requested letter to her per her request. Sent 2 copies of letter.

## 2014-01-14 ENCOUNTER — Other Ambulatory Visit: Payer: Self-pay | Admitting: Family Medicine

## 2014-02-13 ENCOUNTER — Other Ambulatory Visit: Payer: Self-pay | Admitting: Family Medicine

## 2014-03-06 ENCOUNTER — Encounter (HOSPITAL_COMMUNITY): Payer: Self-pay | Admitting: Emergency Medicine

## 2014-03-06 ENCOUNTER — Emergency Department (INDEPENDENT_AMBULATORY_CARE_PROVIDER_SITE_OTHER)
Admission: EM | Admit: 2014-03-06 | Discharge: 2014-03-06 | Disposition: A | Discharge status: Home / Self Care | Payer: Self-pay | Source: Home / Self Care | Attending: Family Medicine | Admitting: Family Medicine

## 2014-03-06 DIAGNOSIS — S134XXA Sprain of ligaments of cervical spine, initial encounter: Secondary | ICD-10-CM

## 2014-03-06 MED ORDER — CYCLOBENZAPRINE HCL 5 MG PO TABS: 5.0000 mg | ORAL_TABLET | Freq: Every evening | ORAL | Status: DC | PRN

## 2014-03-06 MED ORDER — DICLOFENAC SODIUM 50 MG PO TBEC: 50.0000 mg | DELAYED_RELEASE_TABLET | Freq: Two times a day (BID) | ORAL | Status: DC | PRN

## 2014-03-06 NOTE — Discharge Instructions (Signed)
Thank you for coming in today. Come back or go to the emergency room if you notice new weakness new numbness problems walking or bowel or bladder problems.    Cervical Strain and Sprain (Whiplash) with Rehab Cervical strain and sprain are injuries that commonly occur with "whiplash" injuries. Whiplash occurs when the neck is forcefully whipped backward or forward, such as during a motor vehicle accident or during contact sports. The muscles, ligaments, tendons, discs, and nerves of the neck are susceptible to injury when this occurs. RISK FACTORS Risk of having a whiplash injury increases if:  Osteoarthritis of the spine.  Situations that make head or neck accidents or trauma more likely.  High-risk sports (football, rugby, wrestling, hockey, auto racing, gymnastics, diving, contact karate, or boxing).  Poor strength and flexibility of the neck.  Previous neck injury.  Poor tackling technique.  Improperly fitted or padded equipment. SYMPTOMS   Pain or stiffness in the front or back of neck or both.  Symptoms may present immediately or up to 24 hours after injury.  Dizziness, headache, nausea, and vomiting.  Muscle spasm with soreness and stiffness in the neck.  Tenderness and swelling at the injury site. PREVENTION  Learn and use proper technique (avoid tackling with the head, spearing, and head-butting; use proper falling techniques to avoid landing on the head).  Warm up and stretch properly before activity.  Maintain physical fitness:  Strength, flexibility, and endurance.  Cardiovascular fitness.  Wear properly fitted and padded protective equipment, such as padded soft collars, for participation in contact sports. PROGNOSIS  Recovery from cervical strain and sprain injuries is dependent on the extent of the injury. These injuries are usually curable in 1 week to 3 months with appropriate treatment.  RELATED COMPLICATIONS   Temporary numbness and weakness may  occur if the nerve roots are damaged, and this may persist until the nerve has completely healed.  Chronic pain due to frequent recurrence of symptoms.  Prolonged healing, especially if activity is resumed too soon (before complete recovery). TREATMENT  Treatment initially involves the use of ice and medication to help reduce pain and inflammation. It is also important to perform strengthening and stretching exercises and modify activities that worsen symptoms so the injury does not get worse. These exercises may be performed at home or with a therapist. For patients who experience severe symptoms, a soft, padded collar may be recommended to be worn around the neck.  Improving your posture may help reduce symptoms. Posture improvement includes pulling your chin and abdomen in while sitting or standing. If you are sitting, sit in a firm chair with your buttocks against the back of the chair. While sleeping, try replacing your pillow with a small towel rolled to 2 inches in diameter, or use a cervical pillow or soft cervical collar. Poor sleeping positions delay healing.  For patients with nerve root damage, which causes numbness or weakness, the use of a cervical traction apparatus may be recommended. Surgery is rarely necessary for these injuries. However, cervical strain and sprains that are present at birth (congenital) may require surgery. MEDICATION   If pain medication is necessary, nonsteroidal anti-inflammatory medications, such as aspirin and ibuprofen, or other minor pain relievers, such as acetaminophen, are often recommended.  Do not take pain medication for 7 days before surgery.  Prescription pain relievers may be given if deemed necessary by your caregiver. Use only as directed and only as much as you need. HEAT AND COLD:   Cold treatment (icing)  relieves pain and reduces inflammation. Cold treatment should be applied for 10 to 15 minutes every 2 to 3 hours for inflammation and pain  and immediately after any activity that aggravates your symptoms. Use ice packs or an ice massage.  Heat treatment may be used prior to performing the stretching and strengthening activities prescribed by your caregiver, physical therapist, or athletic trainer. Use a heat pack or a warm soak. SEEK MEDICAL CARE IF:   Symptoms get worse or do not improve in 2 weeks despite treatment.  New, unexplained symptoms develop (drugs used in treatment may produce side effects). EXERCISES RANGE OF MOTION (ROM) AND STRETCHING EXERCISES - Cervical Strain and Sprain These exercises may help you when beginning to rehabilitate your injury. In order to successfully resolve your symptoms, you must improve your posture. These exercises are designed to help reduce the forward-head and rounded-shoulder posture which contributes to this condition. Your symptoms may resolve with or without further involvement from your physician, physical therapist or athletic trainer. While completing these exercises, remember:   Restoring tissue flexibility helps normal motion to return to the joints. This allows healthier, less painful movement and activity.  An effective stretch should be held for at least 20 seconds, although you may need to begin with shorter hold times for comfort.  A stretch should never be painful. You should only feel a gentle lengthening or release in the stretched tissue. STRETCH- Axial Extensors  Lie on your back on the floor. You may bend your knees for comfort. Place a rolled-up hand towel or dish towel, about 2 inches in diameter, under the part of your head that makes contact with the floor.  Gently tuck your chin, as if trying to make a "double chin," until you feel a gentle stretch at the base of your head.  Hold __________ seconds. Repeat __________ times. Complete this exercise __________ times per day.  STRETCH - Axial Extension   Stand or sit on a firm surface. Assume a good posture: chest  up, shoulders drawn back, abdominal muscles slightly tense, knees unlocked (if standing) and feet hip width apart.  Slowly retract your chin so your head slides back and your chin slightly lowers. Continue to look straight ahead.  You should feel a gentle stretch in the back of your head. Be certain not to feel an aggressive stretch since this can cause headaches later.  Hold for __________ seconds. Repeat __________ times. Complete this exercise __________ times per day. STRETCH - Cervical Side Bend   Stand or sit on a firm surface. Assume a good posture: chest up, shoulders drawn back, abdominal muscles slightly tense, knees unlocked (if standing) and feet hip width apart.  Without letting your nose or shoulders move, slowly tip your right / left ear to your shoulder until your feel a gentle stretch in the muscles on the opposite side of your neck.  Hold __________ seconds. Repeat __________ times. Complete this exercise __________ times per day. STRETCH - Cervical Rotators   Stand or sit on a firm surface. Assume a good posture: chest up, shoulders drawn back, abdominal muscles slightly tense, knees unlocked (if standing) and feet hip width apart.  Keeping your eyes level with the ground, slowly turn your head until you feel a gentle stretch along the back and opposite side of your neck.  Hold __________ seconds. Repeat __________ times. Complete this exercise __________ times per day. RANGE OF MOTION - Neck Circles   Stand or sit on a firm surface. Assume  a good posture: chest up, shoulders drawn back, abdominal muscles slightly tense, knees unlocked (if standing) and feet hip width apart.  Gently roll your head down and around from the back of one shoulder to the back of the other. The motion should never be forced or painful.  Repeat the motion 10-20 times, or until you feel the neck muscles relax and loosen. Repeat __________ times. Complete the exercise __________ times per  day. STRENGTHENING EXERCISES - Cervical Strain and Sprain These exercises may help you when beginning to rehabilitate your injury. They may resolve your symptoms with or without further involvement from your physician, physical therapist, or athletic trainer. While completing these exercises, remember:   Muscles can gain both the endurance and the strength needed for everyday activities through controlled exercises.  Complete these exercises as instructed by your physician, physical therapist, or athletic trainer. Progress the resistance and repetitions only as guided.  You may experience muscle soreness or fatigue, but the pain or discomfort you are trying to eliminate should never worsen during these exercises. If this pain does worsen, stop and make certain you are following the directions exactly. If the pain is still present after adjustments, discontinue the exercise until you can discuss the trouble with your clinician. STRENGTH - Cervical Flexors, Isometric  Face a wall, standing about 6 inches away. Place a small pillow, a ball about 6-8 inches in diameter, or a folded towel between your forehead and the wall.  Slightly tuck your chin and gently push your forehead into the soft object. Push only with mild to moderate intensity, building up tension gradually. Keep your jaw and forehead relaxed.  Hold 10 to 20 seconds. Keep your breathing relaxed.  Release the tension slowly. Relax your neck muscles completely before you start the next repetition. Repeat __________ times. Complete this exercise __________ times per day. STRENGTH- Cervical Lateral Flexors, Isometric   Stand about 6 inches away from a wall. Place a small pillow, a ball about 6-8 inches in diameter, or a folded towel between the side of your head and the wall.  Slightly tuck your chin and gently tilt your head into the soft object. Push only with mild to moderate intensity, building up tension gradually. Keep your jaw and  forehead relaxed.  Hold 10 to 20 seconds. Keep your breathing relaxed.  Release the tension slowly. Relax your neck muscles completely before you start the next repetition. Repeat __________ times. Complete this exercise __________ times per day. STRENGTH - Cervical Extensors, Isometric   Stand about 6 inches away from a wall. Place a small pillow, a ball about 6-8 inches in diameter, or a folded towel between the back of your head and the wall.  Slightly tuck your chin and gently tilt your head back into the soft object. Push only with mild to moderate intensity, building up tension gradually. Keep your jaw and forehead relaxed.  Hold 10 to 20 seconds. Keep your breathing relaxed.  Release the tension slowly. Relax your neck muscles completely before you start the next repetition. Repeat __________ times. Complete this exercise __________ times per day. POSTURE AND BODY MECHANICS CONSIDERATIONS - Cervical Strain and Sprain Keeping correct posture when sitting, standing or completing your activities will reduce the stress put on different body tissues, allowing injured tissues a chance to heal and limiting painful experiences. The following are general guidelines for improved posture. Your physician or physical therapist will provide you with any instructions specific to your needs. While reading these guidelines, remember:  The exercises prescribed by your provider will help you have the flexibility and strength to maintain correct postures.  The correct posture provides the optimal environment for your joints to work. All of your joints have less wear and tear when properly supported by a spine with good posture. This means you will experience a healthier, less painful body.  Correct posture must be practiced with all of your activities, especially prolonged sitting and standing. Correct posture is as important when doing repetitive low-stress activities (typing) as it is when doing a single  heavy-load activity (lifting). PROLONGED STANDING WHILE SLIGHTLY LEANING FORWARD When completing a task that requires you to lean forward while standing in one place for a long time, place either foot up on a stationary 2- to 4-inch high object to help maintain the best posture. When both feet are on the ground, the low back tends to lose its slight inward curve. If this curve flattens (or becomes too large), then the back and your other joints will experience too much stress, fatigue more quickly, and can cause pain.  RESTING POSITIONS Consider which positions are most painful for you when choosing a resting position. If you have pain with flexion-based activities (sitting, bending, stooping, squatting), choose a position that allows you to rest in a less flexed posture. You would want to avoid curling into a fetal position on your side. If your pain worsens with extension-based activities (prolonged standing, working overhead), avoid resting in an extended position such as sleeping on your stomach. Most people will find more comfort when they rest with their spine in a more neutral position, neither too rounded nor too arched. Lying on a non-sagging bed on your side with a pillow between your knees, or on your back with a pillow under your knees will often provide some relief. Keep in mind, being in any one position for a prolonged period of time, no matter how correct your posture, can still lead to stiffness. WALKING Walk with an upright posture. Your ears, shoulders, and hips should all line up. OFFICE WORK When working at a desk, create an environment that supports good, upright posture. Without extra support, muscles fatigue and lead to excessive strain on joints and other tissues. CHAIR:  A chair should be able to slide under your desk when your back makes contact with the back of the chair. This allows you to work closely.  The chair's height should allow your eyes to be level with the upper  part of your monitor and your hands to be slightly lower than your elbows.  Body position:  Your feet should make contact with the floor. If this is not possible, use a foot rest.  Keep your ears over your shoulders. This will reduce stress on your neck and low back. Document Released: 01/04/2005 Document Revised: 05/21/2013 Document Reviewed: 04/18/2008 [facility] Patient Information 2015 [place], [place]. This information is not intended to replace advice given to you by your health care provider. Make sure you discuss any questions you have with your health care provider.
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
## 2014-03-13 ENCOUNTER — Ambulatory Visit: Payer: Self-pay | Admitting: Family Medicine

## 2014-03-19 ENCOUNTER — Other Ambulatory Visit: Payer: Self-pay | Admitting: Family Medicine

## 2014-03-20 ENCOUNTER — Other Ambulatory Visit: Payer: Self-pay | Admitting: Family Medicine

## 2014-03-21 ENCOUNTER — Ambulatory Visit: Payer: Self-pay | Attending: Family Medicine

## 2014-03-21 DIAGNOSIS — M25512 Pain in left shoulder: Secondary | ICD-10-CM | POA: Insufficient documentation

## 2014-03-21 DIAGNOSIS — M542 Cervicalgia: Secondary | ICD-10-CM | POA: Insufficient documentation

## 2014-03-21 DIAGNOSIS — M62838 Other muscle spasm: Secondary | ICD-10-CM | POA: Insufficient documentation
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
## 2014-03-21 NOTE — Patient Instructions (Signed)
HEP with posture and cervical rotation ,sidebend and levator stretch. And use of support for LT arm with pillow

## 2014-03-29 ENCOUNTER — Ambulatory Visit: Payer: Self-pay

## 2014-03-29 DIAGNOSIS — M542 Cervicalgia: Secondary | ICD-10-CM

## 2014-03-29 NOTE — Therapy (Signed)
[place], [place], [address] Phone: [phone]   Fax:  [phone]  Physical Therapy Treatment  Patient Details  Name: Regina Burns MRN: [id number] Date of Birth: 1955/08/06 Referring Provider:  Lorna Few, DO  Encounter Date: 03/29/2014      PT End of Session - 03/29/14 1009    Visit Number 2   Number of Visits 8   Date for PT Re-Evaluation 04/18/14   PT Start Time 0930   PT Stop Time 1023   PT Time Calculation (min) 53 min   Activity Tolerance Patient tolerated treatment well   Behavior During Therapy [facility] for tasks assessed/performed      Past Medical History  Diagnosis Date  . GERD (gastroesophageal reflux disease) 2005  . Hypertension 2012  . Hyperlipidemia 2012    Past Surgical History  Procedure Laterality Date  . Appendectomy  1965  . Hernia repair  1965    There were no vitals filed for this visit.  Visit Diagnosis:  Nonspecific pain in the neck region      Subjective Assessment - 03/29/14 1012    Symptoms Was on LT at eval                       [facility] Adult PT Treatment/Exercise - 03/29/14 0001    Neck Exercises: Seated   Other Seated Exercise REviewed all neck exercises from HEP and needed cues for all directions.    Modalities   Modalities Ultrasound;Moist Heat   Moist Heat Therapy   Number Minutes Moist Heat 15 Minutes   Moist Heat Location --  neck   Ultrasound   Ultrasound Location RT neck   Ultrasound Parameters 100% , ! MHz, 1.6 Wcm2   Ultrasound Goals Pain   Manual Therapy   Manual Therapy Joint mobilization;Massage   Joint Mobilization Gr 2-3 PA mobs RT lateral neck   Massage STW to RT paraspinals and scalenes.                PT Education - 03/29/14 1009    Education provided Yes   Education Details needed cues for correct technique   Person(s) Educated Patient   Methods Demonstration;Tactile cues;Verbal cues   Comprehension Verbalized understanding;Returned demonstration             PT Long Term Goals - 03/29/14 1011    PT LONG TERM GOAL #1   Title Independent with all HEP issued as of last visit   Status On-going   PT LONG TERM GOAL #2   Title She will report no pain with normal activity   Status On-going   PT LONG TERM GOAL #3   Title she will be able to assume good posture   Status On-going               Plan - 03/29/14 1010    Clinical Impression Statement Continue with ful range. Needed cues for correct HEP. She liked the modalities   PT Next Visit Plan Continue modalities and manual, possible stabilization exercises   Consulted and Agree with Plan of Care Patient        Problem List Patient Active Problem List   Diagnosis Date Noted  . Caregiver stress 12/25/2013  . SLEEP APNEA 12/31/2009  . LEG EDEMA, BILATERAL 12/31/2009  . FREQUENCY, URINARY 08/25/2009  . HYPERTENSION, BENIGN ESSENTIAL 08/09/2006  . SYMPTOM, DIZZINESS AND GIDDINESS 08/09/2006  . OBESITY, NOS 03/17/2006  . GASTROESOPHAGEAL REFLUX,  NO ESOPHAGITIS 03/17/2006  . MENOPAUSAL SYNDROME 03/17/2006    Darrel Hoover PT 03/29/2014, 10:13 AM  [facility] [address] [place], [place], [address] Phone: [phone]   Fax:  [phone]

## 2014-04-02 ENCOUNTER — Ambulatory Visit: Payer: Self-pay | Admitting: Rehabilitation

## 2014-04-02 DIAGNOSIS — M62838 Other muscle spasm: Secondary | ICD-10-CM

## 2014-04-02 DIAGNOSIS — M542 Cervicalgia: Secondary | ICD-10-CM

## 2014-04-02 DIAGNOSIS — M25512 Pain in left shoulder: Secondary | ICD-10-CM

## 2014-04-02 NOTE — Therapy (Signed)
[place], [place], [address] Phone: [phone]   Fax:  [phone]  Physical Therapy Treatment  Patient Details  Name: Regina Burns MRN: [id number] Date of Birth: 09/20/1955 Referring Provider:  Lorna Few, DO  Encounter Date: 04/02/2014      PT End of Session - 04/02/14 0948    Visit Number 3   Number of Visits 8   Date for PT Re-Evaluation 04/18/14   PT Start Time 0935   PT Stop Time 1015   PT Time Calculation (min) 40 min      Past Medical History  Diagnosis Date  . GERD (gastroesophageal reflux disease) 2005  . Hypertension 2012  . Hyperlipidemia 2012    Past Surgical History  Procedure Laterality Date  . Appendectomy  1965  . Hernia repair  1965    There were no vitals filed for this visit.  Visit Diagnosis:  Muscle spasm of left shoulder  Nonspecific pain in the neck region  Pain, joint, shoulder, left      Subjective Assessment - 04/02/14 0935    Symptoms 1/10 pain up to 5/10 after sitting still for 5 minutes. Aching.             [facility] PT Assessment - 04/02/14 0937    AROM   AROM Assessment Site Cervical   Cervical Flexion 55   Cervical Extension 55   Cervical - Right Side Bend 50   Cervical - Left Side Bend 60   Cervical - Right Rotation 85   Cervical - Left Rotation 75                   [facility] Adult PT Treatment/Exercise - 04/02/14 0953    Neck Exercises: Seated   Neck Retraction 10 reps   Other Seated Exercise Scapular retraction x10   Neck Exercises: Supine   Neck Retraction 10 reps   Other Supine Exercise neck retraction with horizontal abduction, ER, yellow band hold pullover, wide theraband hold pullover, diagonals x 10 each   Modalities   Modalities Ultrasound;Moist Heat   Ultrasound   Ultrasound Location bil upper traps   Ultrasound Parameters 100% 1 MHZ 1.6w/cm2   Ultrasound Goals Pain                     PT Long Term Goals - 03/29/14 1011    PT LONG TERM GOAL #1   Title Independent with all HEP issued as of last visit   Status On-going   PT LONG TERM GOAL #2   Title She will report no pain with normal activity   Status On-going   PT LONG TERM GOAL #3   Title she will be able to assume good posture   Status On-going               Plan - 04/02/14 0950    Clinical Impression Statement Pt reports ultrasound was helpful. Contiued increased pain with sitting. Progressing toward goals.    PT Next Visit Plan Continue modalities and manual, stabilization exercises, review HEP add scap retract        Problem List Patient Active Problem List   Diagnosis Date Noted  . Caregiver stress 12/25/2013  . SLEEP APNEA 12/31/2009  . LEG EDEMA, BILATERAL 12/31/2009  . FREQUENCY, URINARY 08/25/2009  . HYPERTENSION, BENIGN ESSENTIAL 08/09/2006  . SYMPTOM, DIZZINESS AND GIDDINESS 08/09/2006  . OBESITY, NOS 03/17/2006  . GASTROESOPHAGEAL REFLUX, NO ESOPHAGITIS 03/17/2006  . MENOPAUSAL  SYNDROME 03/17/2006    Dorene Ar, PTA 04/02/2014, 3:12 PM  [facility] [address] [place], [place], [address] Phone: [phone]   Fax:  [phone]

## 2014-04-04 ENCOUNTER — Ambulatory Visit: Payer: Self-pay | Admitting: Rehabilitation

## 2014-04-04 DIAGNOSIS — M25512 Pain in left shoulder: Secondary | ICD-10-CM

## 2014-04-04 DIAGNOSIS — M62838 Other muscle spasm: Secondary | ICD-10-CM

## 2014-04-04 DIAGNOSIS — M542 Cervicalgia: Secondary | ICD-10-CM

## 2014-04-04 NOTE — Therapy (Signed)
[place], [place], [address] Phone: [phone]   Fax:  [phone]  Physical Therapy Treatment  Patient Details  Name: Regina Burns MRN: [id number] Date of Birth: 28-Nov-1955 Referring Provider:  Lorna Few, DO  Encounter Date: 04/04/2014      PT End of Session - 04/04/14 0951    Visit Number 4   Number of Visits 8   Date for PT Re-Evaluation 04/18/14   PT Start Time 0935   PT Stop Time 1015   PT Time Calculation (min) 40 min      Past Medical History  Diagnosis Date  . GERD (gastroesophageal reflux disease) 2005  . Hypertension 2012  . Hyperlipidemia 2012    Past Surgical History  Procedure Laterality Date  . Appendectomy  1965  . Hernia repair  1965    There were no vitals filed for this visit.  Visit Diagnosis:  Muscle spasm of left shoulder  Nonspecific pain in the neck region  Pain, joint, shoulder, left                     [facility] Adult PT Treatment/Exercise - 04/04/14 0943    Neck Exercises: Theraband   Rows 10 reps   Rows Limitations seated   Neck Exercises: Seated   Neck Retraction 10 reps   Other Seated Exercise Scapular retraction x10   Neck Exercises: Supine   Other Supine Exercise neck retraction with horizontal abduction, ER, yellow band hold pullover, wide theraband hold pullover, diagonals x 10 each   Modalities   Modalities Ultrasound;Moist Heat   Ultrasound   Ultrasound Location bilateral upper traps   Ultrasound Parameters 100% 1.6 w/cm2 1MHZ   Ultrasound Goals Pain   Neck Exercises: Stretches   Upper Trapezius Stretch 3 reps;30 seconds   Levator Stretch 3 reps;30 seconds                PT Education - 04/04/14 0947    Education provided Yes   Education Details Supine yellow band pullovers narrow and wide, ER, diagonals.   Person(s) Educated Patient   Methods Explanation;Handout   Comprehension Verbalized  understanding             PT Long Term Goals - 04/04/14 0937    PT LONG TERM GOAL #1   Title Independent with all HEP issued as of last visit   Time 4   Period Weeks   Status On-going   PT LONG TERM GOAL #2   Title She will report no pain with normal activity   Time 4   Period Weeks   Status On-going   PT LONG TERM GOAL #3   Title she will be able to assume good posture   Time 4   Period Weeks   Status Achieved               Plan - 04/04/14 0937    Clinical Impression Statement sitting improved to 15 minutes without increased pain. Left upper trap gets tight. My left shoulder used to dislocate when I was younger. It has not done it in 37 years but I feel it when i go too far over head with my left arm.    PT Next Visit Plan continue theraband and stab review, cont ultrasound. Probable DC next week        Problem List Patient Active Problem List   Diagnosis Date Noted  . Caregiver stress 12/25/2013  . SLEEP APNEA  12/31/2009  . LEG EDEMA, BILATERAL 12/31/2009  . FREQUENCY, URINARY 08/25/2009  . HYPERTENSION, BENIGN ESSENTIAL 08/09/2006  . SYMPTOM, DIZZINESS AND GIDDINESS 08/09/2006  . OBESITY, NOS 03/17/2006  . GASTROESOPHAGEAL REFLUX, NO ESOPHAGITIS 03/17/2006  . MENOPAUSAL SYNDROME 03/17/2006    Dorene Ar, PTA 04/04/2014, 10:49 AM  [facility] [address] [place], [place], [address] Phone: [phone]   Fax:  [phone]

## 2014-04-04 NOTE — Patient Instructions (Addendum)
Scapular Retraction (Standing)   With arms at sides, pinch shoulder blades together. Repeat __10__ times per set. Do __2__ sets per session. Do _2___ sessions per day.       Lay on back. Gently press head into pillow. Gently pull band apart and pullover head and then to lap x 10                Pull band apart wider and pullover head and then to lap x 10                Keep elbows at side and pull hands apart x 10                Raise arms up above chest and pull band apart so band hits you in the chest x 10                Perform diagonal pulls from opposite hip x 10 each side See handout

## 2014-04-09 ENCOUNTER — Ambulatory Visit: Payer: Self-pay

## 2014-04-09 DIAGNOSIS — M62838 Other muscle spasm: Secondary | ICD-10-CM

## 2014-04-09 NOTE — Patient Instructions (Signed)
Added band exercises with External rotation and extension.   Pt demo correctly . She will do daily 10-20 reps and hold 1-2 sec. Red band issued

## 2014-04-09 NOTE — Therapy (Signed)
[place], [place], [address] Phone: [phone]   Fax:  [phone]  Physical Therapy Treatment  Patient Details  Name: Regina Burns MRN: [id number] Date of Birth: [birth year] Referring Provider:  Lorna Few, DO  Encounter Date: 04/09/2014      PT End of Session - 04/09/14 1002    Visit Number 5   Number of Visits 8   Date for PT Re-Evaluation 04/18/14   PT Start Time 0933   PT Stop Time 1002   PT Time Calculation (min) 29 min   Activity Tolerance Patient tolerated treatment well   Behavior During Therapy [facility] for tasks assessed/performed      Past Medical History  Diagnosis Date  . GERD (gastroesophageal reflux disease) 2005  . Hypertension 2012  . Hyperlipidemia 2012    Past Surgical History  Procedure Laterality Date  . Appendectomy  1965  . Hernia repair  1965    There were no vitals filed for this visit.  Visit Diagnosis:  Muscle spasm of left shoulder      Subjective Assessment - 04/09/14 0936    Symptoms No pain this AM. She reports improvement in pain.   80% . Main problem lower neck pain comes and goes   Currently in Pain? No/denies   Multiple Pain Sites No                       [facility] Adult PT Treatment/Exercise - 04/09/14 0943    Neck Exercises: Seated   Other Seated Exercise REviewed HEP but in seated position. Used yellow and red band and red band issued     Added ER and extension exercise with band (red0 for home, no pain end of session           PT Education - 04/09/14 1002    Education provided Yes   Education Details sitting band exercises   Person(s) Educated Patient   Methods Explanation;Demonstration;Verbal cues;Handout   Comprehension Verbalized understanding;Returned demonstration             PT Long Term Goals - 04/04/14 4166    PT LONG TERM GOAL #1   Title Independent with all HEP issued as of last visit   Time 4   Period Weeks   Status On-going   PT LONG TERM GOAL #2   Title She will report no pain with normal activity   Time 4   Period Weeks   Status On-going   PT LONG TERM GOAL #3   Title she will be able to assume good posture   Time 4   Period Weeks   Status Achieved               Plan - 04/09/14 1003    Clinical Impression Statement Significant improvement with pain and use of LT arm. Will progress HEP and dischaarge per plan.   Rehab Potential Good   PT Next Visit Plan Progress exercise with stabilization exercises   PT Home Exercise Plan strength   Consulted and Agree with Plan of Care Patient        Problem List Patient Active Problem List   Diagnosis Date Noted  . Caregiver stress 12/25/2013  . SLEEP APNEA 12/31/2009  . LEG EDEMA, BILATERAL 12/31/2009  . FREQUENCY, URINARY 08/25/2009  . HYPERTENSION, BENIGN ESSENTIAL 08/09/2006  . SYMPTOM, DIZZINESS AND GIDDINESS 08/09/2006  . OBESITY, NOS 03/17/2006  . GASTROESOPHAGEAL REFLUX, NO ESOPHAGITIS 03/17/2006  . MENOPAUSAL  SYNDROME 03/17/2006    Darrel Hoover PT 04/09/2014, 10:04 AM  [facility] [address] [place], [place], [address] Phone: [phone]   Fax:  [phone]

## 2014-04-11 ENCOUNTER — Ambulatory Visit: Payer: Self-pay

## 2014-04-11 DIAGNOSIS — M62838 Other muscle spasm: Secondary | ICD-10-CM

## 2014-04-11 NOTE — Therapy (Signed)
[place], [place], [address] Phone: [phone]   Fax:  [phone]  Physical Therapy Treatment  Patient Details  Name: Regina Burns MRN: [id number] Date of Birth: May 16, 1955 Referring Provider:  Lorna Few, DO  Encounter Date: 04/11/2014      PT End of Session - 04/11/14 0944    Visit Number 6   Number of Visits 8   Date for PT Re-Evaluation 04/18/14   PT Start Time 0933   PT Stop Time 0945   PT Time Calculation (min) 12 min   Behavior During Therapy [facility] for tasks assessed/performed      Past Medical History  Diagnosis Date  . GERD (gastroesophageal reflux disease) 2005  . Hypertension 2012  . Hyperlipidemia 2012    Past Surgical History  Procedure Laterality Date  . Appendectomy  1965  . Hernia repair  1965    There were no vitals filed for this visit.  Visit Diagnosis:  Muscle spasm of left shoulder      Subjective Assessment - 04/11/14 0930    Symptoms No pain today no pain the last few days . The exercises are helping. No Questions   Currently in Pain? No/denies   Multiple Pain Sites No          No treatment as she is pain free so we will hold for 1-2 weeks and discharge if not returned.                       PT Education - 04/11/14 0944    Education provided Yes   Education Details POC   Person(s) Educated Patient   Methods Explanation   Comprehension Verbalized understanding             PT Long Term Goals - 04/11/14 0947    PT LONG TERM GOAL #1   Title Independent with all HEP issued as of last visit   Status Achieved   PT LONG TERM GOAL #2   Title She will report no pain with normal activity   Status Achieved   PT LONG TERM GOAL #3   Title she will be able to assume good posture   Status Achieved               Plan - 04/11/14 0945    Clinical Impression Statement She has had no pain and feels like she is  better with the exercises and may not need therapy now. She agreed to hold PT for 7-14 days and discharge if she does not return. She can call to schedule in next 2 weeks if neeeded   Consulted and Agree with Plan of Care Patient        Problem List Patient Active Problem List   Diagnosis Date Noted  . Caregiver stress 12/25/2013  . SLEEP APNEA 12/31/2009  . LEG EDEMA, BILATERAL 12/31/2009  . FREQUENCY, URINARY 08/25/2009  . HYPERTENSION, BENIGN ESSENTIAL 08/09/2006  . SYMPTOM, DIZZINESS AND GIDDINESS 08/09/2006  . OBESITY, NOS 03/17/2006  . GASTROESOPHAGEAL REFLUX, NO ESOPHAGITIS 03/17/2006  . MENOPAUSAL SYNDROME 03/17/2006    Darrel Hoover PT 04/11/2014, 9:47 AM  [facility] [address] [place], [place], [address] Phone: [phone]   Fax:  [phone]

## 2014-04-24 ENCOUNTER — Other Ambulatory Visit: Payer: Self-pay | Admitting: Family Medicine

## 2014-05-28 ENCOUNTER — Other Ambulatory Visit: Payer: Self-pay | Admitting: Family Medicine

## 2014-06-05 ENCOUNTER — Ambulatory Visit (INDEPENDENT_AMBULATORY_CARE_PROVIDER_SITE_OTHER): Payer: Self-pay | Admitting: Family Medicine

## 2014-06-05 ENCOUNTER — Encounter: Payer: Self-pay | Admitting: Family Medicine

## 2014-06-05 VITALS — BP 129/70 | HR 95 | Ht 67.5 in | Wt 318.6 lb

## 2014-06-05 DIAGNOSIS — M25562 Pain in left knee: Secondary | ICD-10-CM

## 2014-06-05 DIAGNOSIS — Z636 Dependent relative needing care at home: Secondary | ICD-10-CM

## 2014-06-05 DIAGNOSIS — G2581 Restless legs syndrome: Secondary | ICD-10-CM

## 2014-06-05 MED ORDER — GABAPENTIN 100 MG PO CAPS: 100.0000 mg | ORAL_CAPSULE | Freq: Every day | ORAL | Status: DC

## 2014-06-05 MED ORDER — TROLAMINE SALICYLATE 10 % EX CREA: 1.0000 "application " | TOPICAL_CREAM | CUTANEOUS | Status: DC | PRN

## 2014-06-05 NOTE — Patient Instructions (Addendum)
Osteoarthritis Osteoarthritis is a disease that causes soreness and inflammation of a joint. It occurs when the cartilage at the affected joint wears down. Cartilage acts as a cushion, covering the ends of bones where they meet to form a joint. Osteoarthritis is the most common form of arthritis. It often occurs in older people. The joints affected most often by this condition include those in the:  Ends of the fingers.  Thumbs.  Neck.  Lower back.  Knees.  Hips. CAUSES  Over time, the cartilage that covers the ends of bones begins to wear away. This causes bone to rub on bone, producing pain and stiffness in the affected joints.  RISK FACTORS Certain factors can increase your chances of having osteoarthritis, including:  Older age.  Excessive body weight.  Overuse of joints.  Previous joint injury. SIGNS AND SYMPTOMS   Pain, swelling, and stiffness in the joint.  Over time, the joint may lose its normal shape.  Small deposits of bone (osteophytes) may grow on the edges of the joint.  Bits of bone or cartilage can break off and float inside the joint space. This may cause more pain and damage. DIAGNOSIS  Your health care provider will do a physical exam and ask about your symptoms. Various tests may be ordered, such as:  X-rays of the affected joint.  An MRI scan.  Blood tests to rule out other types of arthritis.  Joint fluid tests. This involves using a needle to draw fluid from the joint and examining the fluid under a microscope. TREATMENT  Goals of treatment are to control pain and improve joint function. Treatment plans may include:  A prescribed exercise program that allows for rest and joint relief.  A weight control plan.  Pain relief techniques, such as:  Properly applied heat and cold.  Electric pulses delivered to nerve endings under the skin (transcutaneous electrical nerve stimulation [TENS]).  Massage.  Certain nutritional  supplements.  Medicines to control pain, such as:  Acetaminophen.  Nonsteroidal anti-inflammatory drugs (NSAIDs), such as naproxen.  Narcotic or central-acting agents, such as tramadol.  Corticosteroids. These can be given orally or as an injection.  Surgery to reposition the bones and relieve pain (osteotomy) or to remove loose pieces of bone and cartilage. Joint replacement may be needed in advanced states of osteoarthritis. HOME CARE INSTRUCTIONS   Take medicines only as directed by your health care provider.  Maintain a healthy weight. Follow your health care provider's instructions for weight control. This may include dietary instructions.  Exercise as directed. Your health care provider can recommend specific types of exercise. These may include:  Strengthening exercises. These are done to strengthen the muscles that support joints affected by arthritis. They can be performed with weights or with exercise bands to add resistance.  Aerobic activities. These are exercises, such as brisk walking or low-impact aerobics, that get your heart pumping.  Range-of-motion activities. These keep your joints limber.  Balance and agility exercises. These help you maintain daily living skills.  Rest your affected joints as directed by your health care provider.  Keep all follow-up visits as directed by your health care provider. SEEK MEDICAL CARE IF:   Your skin turns red.  You develop a rash in addition to your joint pain.  You have worsening joint pain.  You have a fever along with joint or muscle aches. SEEK IMMEDIATE MEDICAL CARE IF:  You have a significant loss of weight or appetite.  You have night sweats. FOR MORE  [place] of Arthritis and Musculoskeletal and Skin Diseases: www.niams.SouthExposed.es  [employer] on Aging: http://kim-miller.com/  American College of Rheumatology: www.rheumatology.org Document Released: 01/04/2005 Document Revised:  05/21/2013 Document Reviewed: 09/11/2012 [facility] Patient Information 2015 [place], [place]. This information is not intended to replace advice given to you by your health care provider. Make sure you discuss any questions you have with your health care provider.  Restless Legs Syndrome Restless legs syndrome is a movement disorder. It may also be called a sensorimotor disorder.  CAUSES  No one knows what specifically causes restless legs syndrome, but it tends to run in families. It is also more common in people with low iron, in pregnancy, in people who need dialysis, and those with nerve damage (neuropathy).Some medications may make restless legs syndrome worse.Those medications include drugs to treat high blood pressure, some heart conditions, nausea, colds, allergies, and depression. SYMPTOMS Symptoms include uncomfortable sensations in the legs. These leg sensations are worse during periods of inactivity or rest. They are also worse while sitting or lying down. Individuals that have the disorder describe sensations in the legs that feel like:  Pulling.  Drawing.  Crawling.  Worming.  Boring.  Tingling.  Pins and needles.  Prickling.  Pain. The sensations are usually accompanied by an overwhelming urge to move the legs. Sudden muscle jerks may also occur. Movement provides temporary relief from the discomfort. In rare cases, the arms may also be affected. Symptoms may interfere with going to sleep (sleep onset insomnia). Restless legs syndrome may also be related to periodic limb movement disorder (PLMD). PLMD is another more common motor disorder. It also causes interrupted sleep. The symptoms from PLMD usually occur most often when you are awake. TREATMENT  Treatment for restless legs syndrome is symptomatic. This means that the symptoms are treated.   Massage and cold compresses may provide temporary relief.  Walk, stretch, or take a cold or hot bath.  Get regular exercise  and a good night's sleep.  Avoid caffeine, alcohol, nicotine, and medications that can make it worse.  Do activities that provide mental stimulation like discussions, needlework, and video games. These may be helpful if you are not able to walk or stretch. Some medications are effective in relieving the symptoms. However, many of these medications have side effects. Ask your caregiver about medications that may help your symptoms. Correcting iron deficiency may improve symptoms for some patients. Document Released: 12/25/2001 Document Revised: 05/21/2013 Document Reviewed: 04/02/2010 [facility] Patient Information 2015 [place], [place]. This information is not intended to replace advice given to you by your health care provider. Make sure you discuss any questions you have with your health care provider.

## 2014-06-05 NOTE — Progress Notes (Signed)
Subjective:     Patient ID: Lorane Gell, female   DOB: 12-27-1955, 59 y.o.   MRN: [id number]  HPI Mrs. Dicenso is a 59yo female presenting today for knee pain. - Notes history of fall onto knee 63months ago. Has noted knee pain x1 month - Pain is located over left knee - Notes crackles, instability - Has been taking Aleve which helps some. Unsure of dosage but taking it q8hr. States this is working better than Ibuprofen - Pain has made ambulation more difficult - Also complains of sharp shooting nerve pain in legs at night. Improves with movement of legs. - Interested in starting a water aerobics class - Mother is still not in nursing home. It is very stressful for her and she believes this is a major component in her hypertension  Review of Systems  Musculoskeletal: Positive for arthralgias and gait problem.  Psychiatric/Behavioral: Positive for sleep disturbance.      Objective:   Physical Exam  Constitutional: She is oriented to person, place, and time. She appears well-developed and well-nourished. No distress.  Pulmonary/Chest: Effort normal.  Musculoskeletal:  Crepitus noted over left knee, tenderness to palpation along joint lines, negative Lachman's, normal ROM of hips bilaterally, No lower extremity edema noted  Neurological: She is alert and oriented to person, place, and time.  Psychiatric: She has a normal mood and affect. Her behavior is normal.      Assessment:     Please refer to Problem List for Assessment.       Plan:     Please refer to Problem List for Plan.

## 2014-06-07 ENCOUNTER — Other Ambulatory Visit: Payer: Self-pay | Admitting: Family Medicine

## 2014-06-08 DIAGNOSIS — G2581 Restless legs syndrome: Secondary | ICD-10-CM | POA: Insufficient documentation

## 2014-06-08 DIAGNOSIS — M25562 Pain in left knee: Secondary | ICD-10-CM | POA: Insufficient documentation

## 2014-06-08 NOTE — Assessment & Plan Note (Signed)
-   PHQ-9 increased from 4 to 59 - Mother is still not in nursing home. Believes this is significant stressor - Denies SI - Recommended scheduling another appointment to discuss depression. Stated she knew she could call any time she needed additional help

## 2014-06-08 NOTE — Assessment & Plan Note (Signed)
-   Initiated Gabapentin 100mg  at night. Will increase as indicated.

## 2014-06-08 NOTE — Assessment & Plan Note (Signed)
-   Suspect Osteoarthritis - Due to traumatic history, will obtain knee xrays  - Recommended Aspercreme and NSAIDs to treat pain - Discussed importance of exercise for knee arthritis. Encouraged water aerobics. - Will contact with results of Xrays.

## 2014-06-26 ENCOUNTER — Telehealth: Payer: Self-pay | Admitting: Family Medicine

## 2014-06-26 ENCOUNTER — Emergency Department (HOSPITAL_COMMUNITY): Payer: Self-pay

## 2014-06-26 ENCOUNTER — Encounter (HOSPITAL_COMMUNITY): Payer: Self-pay | Admitting: *Deleted

## 2014-06-26 ENCOUNTER — Emergency Department (HOSPITAL_COMMUNITY)
Admission: EM | Admit: 2014-06-26 | Discharge: 2014-06-26 | Disposition: A | Discharge status: Home / Self Care | Payer: Self-pay | Attending: Emergency Medicine | Admitting: Emergency Medicine

## 2014-06-26 DIAGNOSIS — E785 Hyperlipidemia, unspecified: Secondary | ICD-10-CM | POA: Insufficient documentation

## 2014-06-26 DIAGNOSIS — M25562 Pain in left knee: Secondary | ICD-10-CM | POA: Insufficient documentation

## 2014-06-26 DIAGNOSIS — Z8719 Personal history of other diseases of the digestive system: Secondary | ICD-10-CM | POA: Insufficient documentation

## 2014-06-26 DIAGNOSIS — Z87891 Personal history of nicotine dependence: Secondary | ICD-10-CM | POA: Insufficient documentation

## 2014-06-26 DIAGNOSIS — I10 Essential (primary) hypertension: Secondary | ICD-10-CM | POA: Insufficient documentation

## 2014-06-26 DIAGNOSIS — M25462 Effusion, left knee: Secondary | ICD-10-CM | POA: Insufficient documentation

## 2014-06-26 DIAGNOSIS — K029 Dental caries, unspecified: Secondary | ICD-10-CM

## 2014-06-26 MED ORDER — IBUPROFEN 600 MG PO TABS: 600.0000 mg | ORAL_TABLET | Freq: Four times a day (QID) | ORAL | Status: DC | PRN

## 2014-06-26 NOTE — ED Notes (Signed)
Pt reports knee pain x 3 months. Sent by PCP for x-ray. No known injury.

## 2014-06-26 NOTE — ED Provider Notes (Signed)
CSN: [id number]     Arrival date & time 06/26/14  1837 History  This chart was scribed for Brent General, PA-C, working with Dorie Rank, MD by Steva Colder, ED Scribe. The patient was seen in room TR10C/TR10C at 8:01 PM.     Chief Complaint  Patient presents with  . Knee Pain      The history is provided by the patient. No language interpreter was used.    HPI Comments: Regina Burns is a 59 y.o. female with a medical hx of HTN and hyperlipidemia who presents to the Emergency Department complaining of gradual left knee pain onset 3 months. She reports that she was sent to the ED by her PCP for an x-ray of the left knee because she suspected osteoarthritis. She notes that she saw her PCP last on 06/05/14 and that is when she was informed to come and receive a x-ray of her knee. Pt denies any changes to the knee from when she was seen on 06/05/14. She reports that she fell 3-4 months ago. The left knee gives her issues when she sits for a long time and then sometimes when she bears weight on it.  She states that she has tried Ambulance person [facility] with no relief for her symptoms. She denies fevers and any other symptoms.  PCP Junie Panning, DO  Past Medical History  Diagnosis Date  . GERD (gastroesophageal reflux disease) 2005  . Hypertension 2012  . Hyperlipidemia 2012   Past Surgical History  Procedure Laterality Date  . Appendectomy  1965  . Hernia repair  1965   Family History  Problem Relation Age of Onset  . Alcohol abuse Mother   . Alzheimer's disease Mother   . Heart disease Mother   . Diabetes Mother   . Hypertension Mother   . Stroke Mother   . Heart disease Father   . Diabetes Father   . Hyperlipidemia Father   . Hypertension Father   . Alzheimer's disease Maternal Aunt   . Hypertension Paternal Aunt   . Hypertension Paternal Uncle   . Hypertension Maternal Grandmother    History  Substance Use Topics  . Smoking status: Former Smoker    Quit date:  01/18/1998  . Smokeless tobacco: Never Used  . Alcohol Use: No   OB History    No data available     Review of Systems  Constitutional: Negative for fever.  Musculoskeletal: Positive for arthralgias. Negative for joint swelling.  Skin: Negative for color change and wound.      Allergies  Ace inhibitors and Lisinopril  Home Medications   Prior to Admission medications   Medication Sig Start Date End Date Taking? Authorizing Provider  amLODipine (NORVASC) 10 MG tablet TAKE 1 TABLET BY MOUTH EVERY DAY 03/21/14   [place] N Rumley, DO  atenolol-chlorthalidone (TENORETIC) 100-25 MG per tablet TAKE 1 TABLET BY MOUTH EVERY DAY 04/24/14   [place] N Rumley, DO  atenolol-chlorthalidone (TENORETIC) 100-25 MG per tablet TAKE 1 TABLET BY MOUTH EVERY DAY 05/29/14   [place] N Rumley, DO  cyclobenzaprine (FLEXERIL) 5 MG tablet Take 1 tablet (5 mg total) by mouth at bedtime as needed for muscle spasms. 03/06/14   Gregor Hams, MD  diclofenac (VOLTAREN) 50 MG EC tablet Take 1 tablet (50 mg total) by mouth 2 (two) times daily as needed. 03/06/14   Gregor Hams, MD  gabapentin (NEURONTIN) 100 MG capsule Take 1 capsule (100 mg total) by mouth at bedtime. 06/05/14  [place] N Rumley, DO  ibuprofen (ADVIL,MOTRIN) 600 MG tablet Take 1 tablet (600 mg total) by mouth every 6 (six) hours as needed. 06/26/14   Dahlia Bailiff, PA-C  lovastatin (MEVACOR) 20 MG tablet TAKE 2 TABLETS BY MOUTH AT BEDTIME 06/11/14   [place] N Rumley, DO  trolamine salicylate (ASPERCREME/ALOE) 10 % cream Apply 1 application topically as needed for muscle pain. 06/05/14   [place] N Rumley, DO   BP 115/58 mmHg  Pulse 70  Temp(Src) 98 F (36.7 C) (Oral)  Resp 16  SpO2 97% Physical Exam  Constitutional: She is oriented to person, place, and time. She appears well-developed and well-nourished. No distress.  HENT:  Head: Normocephalic and atraumatic.  Eyes: EOM are normal.  Neck: Neck supple. No tracheal deviation present.  Cardiovascular: Normal  rate.   Pulses:      Dorsalis pedis pulses are 2+ on the left side.  Pulmonary/Chest: Effort normal. No respiratory distress.  Musculoskeletal: Normal range of motion.       Left knee: She exhibits effusion. She exhibits normal range of motion, no deformity and no erythema.  Mild joint effusion to left knee. Full active and passive ROM. No erythema, deformity, or warmth. No anterior or posterior medial or lateral instability. Motor strength 5/5.  Neurological: She is alert and oriented to person, place, and time.  Skin: Skin is warm and dry.  Psychiatric: She has a normal mood and affect. Her behavior is normal.  Nursing note and vitals reviewed.   ED Course  Procedures (including critical care time) DIAGNOSTIC STUDIES: Oxygen Saturation is 94% on RA, nl by my interpretation.    COORDINATION OF CARE: 8:09 PM-Discussed treatment plan which includes left knee x-ray, f/u with PCP in 1 week, alternate ibuprofen and tylenol, RICE with pt at bedside and pt agreed to plan.   Labs Review Labs Reviewed - No data to display  Imaging Review Dg Knee Complete 4 Views Left  06/26/2014   CLINICAL DATA:  Anterior left knee pain for 3 months.  EXAM: LEFT KNEE - COMPLETE 4+ VIEW  COMPARISON:  None.  FINDINGS: No fracture of the proximal tibia or distal femur. Patella is normal. Moderate suprapatellar joint effusion.  IMPRESSION: 1.  No fracture or dislocation. 2. Moderate Joint effusion.   Electronically Signed   By: Suzy Bouchard M.D.   On: 06/26/2014 19:49     EKG Interpretation None      MDM   Final diagnoses:  Knee pain, acute, left    Patient here with ongoing knee pain for the past 3 months. Pain is gradual in onset, mild effusion noted on exam. Full range of motion without evidence of septic arthritis. No acute injury. No evidence of gout. Of note patient was sent for x-ray by her PCP, unsure if patient is here solely for outpatient x-ray, or wanted further evaluation, patient states  she isn't sure herself. Patient's knee radiographs are read as normal, there are some mild osteophyte formations consistent with possible early stages of arthritic change. Given patient's history and physical exam, would suspect mild early arthritis. Patient afebrile, hemodynamically stable and in no acute distress. Encouraged RICE therapy, NSAIDs and follow up with PCP. Return precautions discussed, patient verbalizes understanding and agreement of this plan.  I personally performed the services described in this documentation, which was scribed in my presence. The recorded information has been reviewed and is accurate.  BP 115/58 mmHg  Pulse 70  Temp(Src) 98 F (36.7 C) (Oral)  Resp 16  SpO2 97%  Signed,  Dahlia Bailiff, PA-C 8:19 PM    Dahlia Bailiff, PA-C 06/26/14 2020  Dorie Rank, MD 06/26/14 [phone]

## 2014-06-26 NOTE — ED Notes (Signed)
Patient transported to X-ray 

## 2014-06-26 NOTE — Telephone Encounter (Signed)
Pt informed to go to the hospital. Regina Burns, Regina Burns

## 2014-06-26 NOTE — Telephone Encounter (Signed)
Pt wants to know where she is suppose to go to ge t xrays on her knees She forgot

## 2014-06-26 NOTE — Telephone Encounter (Signed)
Needs referral to go to dentist She has the "orange card" and need someone who will take that

## 2014-06-26 NOTE — Discharge Instructions (Signed)

## 2014-06-27 ENCOUNTER — Other Ambulatory Visit: Payer: Self-pay | Admitting: Family Medicine

## 2014-06-28 MED ORDER — ATENOLOL-CHLORTHALIDONE 100-25 MG PO TABS: 1.0000 | ORAL_TABLET | Freq: Every day | ORAL | Status: DC

## 2014-07-02 NOTE — Telephone Encounter (Signed)
Xray was completed on left knee thru the ED.  Want to know if you have viewed these and if so please call with results.

## 2014-07-04 NOTE — Telephone Encounter (Signed)
Called concerning Xray results. Shows moderate arthritic changes, but given appearance of patella concern that it may not be fully demonstrating extent of severity. Discussed results with Sports Medicine who recommended proceeding to MRI without contrast of left knee. Order placed.  Dr. Gerlean Ren

## 2014-07-05 ENCOUNTER — Telehealth: Payer: Self-pay | Admitting: Family Medicine

## 2014-07-05 NOTE — Telephone Encounter (Signed)
LMOVM. Please advise pt her MRI of knee has been scheduled for Tuesday 07/16/14 at 4:00pm (arrival time 3:45pm) at [facility].

## 2014-07-05 NOTE — Telephone Encounter (Signed)
Pt asking to speak with MD, thinks she has a gum infection and cannot get into the dental clinic for a while.

## 2014-07-08 NOTE — Telephone Encounter (Signed)
Patient calling in with concern for abscess tooth. No fever, chills, or PO intolerance. She is on a waiting list for her dentist. I explained she should be seen.   I discussed with her the reasons for seeking emergency care includin fever, pain not controlled with oral medicines, or po intolerance.   I made her an SDA tomorrow.   Laroy Apple, MD [place] Resident, PGY-3 07/08/2014, 9:13 PM

## 2014-07-08 NOTE — Telephone Encounter (Signed)
LMOVM for pt to return call.  Please inform she will need appt. Regina Burns, [name]

## 2014-07-08 NOTE — Telephone Encounter (Signed)
She will need to schedule an appointment to be seen here so we can do an exam.

## 2014-07-09 ENCOUNTER — Encounter: Payer: Self-pay | Admitting: Family Medicine

## 2014-07-09 ENCOUNTER — Ambulatory Visit (INDEPENDENT_AMBULATORY_CARE_PROVIDER_SITE_OTHER): Payer: Self-pay | Admitting: Family Medicine

## 2014-07-09 VITALS — BP 129/66 | HR 61 | Temp 98.0°F | Ht 68.0 in | Wt 315.3 lb

## 2014-07-09 DIAGNOSIS — K047 Periapical abscess without sinus: Secondary | ICD-10-CM

## 2014-07-09 MED ORDER — CLINDAMYCIN HCL 150 MG PO CAPS: 450.0000 mg | ORAL_CAPSULE | Freq: Three times a day (TID) | ORAL | Status: DC

## 2014-07-09 NOTE — Patient Instructions (Signed)
Take clindamycin 450 mg 3 times a day for 10 days Be sure to try and get in with a dentist. This may actually happen quicker once you have the [facility] card. Please let us know once you have this and we can refer you to a dentist to see if you can get in sooner. Removal of the tooth is the main treatment. Be sure to take better care of teeth as we discussed. Brush twice daily, rinse with water after any food or drink intake, and be sure to floss once daily. Use a mouthwash with fluoride once a day in the evening. If you have any fevers, facial swelling, neck swelling, or other severe symptoms, seek immediate reevaluation.

## 2014-07-09 NOTE — Progress Notes (Signed)
Patient ID: Regina Burns, female   DOB: 03-07-1955, 59 y.o.   MRN: [id number] Subjective:   CC: Dental pain  HPI:   SDA for dental pain. She reports left lower tooth pain for 2 weeks that is worse in the evenings. She is on the waiting list for a dental clinic. She denies fevers, chills, asymmetry of face, neck swelling, difficulty breathing, or myalgias but reports some swelling at the tooth. She has a similar painful area at a right upper tooth as well. She occasionally gets the taste of blood but no pus. She brushes every morning but does not brush in the evening and does not floss. She has a history of needing other teeth pulled. Likes crunchy foods.   Review of Systems - Per HPI.   PMH - dizziness, sleep apnea, restless legs, obesity, menopausal syndrome, lower extremity edema, hypertension, GERD, caregiver stress SH-patient has [facility] financial assistance and is applying for the [facility] card    Objective:  Physical Exam BP 129/66 mmHg  Pulse 61  Temp(Src) 98 F (36.7 C) (Oral)  Ht 5\' 8"  (1.727 m)  Wt 315 lb 5 oz (143.025 kg)  BMI 47.95 kg/m2 GEN: NAD, pleasant HEENT: AT/[place], sclera clear, EOMI, o/p with poor dentition, upper right tooth broken but with no abscess, left lower premolar loose in gum with tenderness but no purulence, bleeding, or obvious abscess Able to open mouth easily, neck supple, no LAD, no tenderness in jaw or neck, no neck swelling, no facial swelling or asymmetry PULM: Normal effort    Assessment:     Regina Burns is a 59 y.o. female here for dental pain.    Plan:     # See problem list and after visit summary for problem-specific plans.   # Health Maintenance: Discussed the importance of regular dental hygiene  Follow-up: Follow up in 1 week if lack of improvement in pain.     Hilton Sinclair, MD [place]

## 2014-07-10 DIAGNOSIS — K047 Periapical abscess without sinus: Secondary | ICD-10-CM | POA: Insufficient documentation

## 2014-07-10 NOTE — Assessment & Plan Note (Signed)
Dental pain in multiple areas with very poor dentition including loose left lower molar and broken right upper tooth on exam. No sign of abscess, no systemic symptom or fever, no facial swelling, neck swelling, or asymmetry, and patient is well appearing and able to drink normally. -clindamycin 450 mg 3 times a day PO for 10 days -Dental visit needed for tooth removal. Asked patient to notify us once she has orange card so that we could place dental referral for potential sooner visit (on waiting list via [place] at this time). -Discussed improved dental hygiene. -Reasons for immediate reevaluation reviewed including fever, facial swelling, neck swelling. -Precepted with Dr Lindell Noe.

## 2014-07-11 ENCOUNTER — Ambulatory Visit: Payer: Self-pay

## 2014-07-16 ENCOUNTER — Ambulatory Visit (HOSPITAL_COMMUNITY)
Admission: RE | Admit: 2014-07-16 | Discharge: 2014-07-16 | Disposition: A | Discharge status: Home / Self Care | Payer: Self-pay | Source: Ambulatory Visit | Attending: Family Medicine | Admitting: Family Medicine

## 2014-07-16 ENCOUNTER — Telehealth: Payer: Self-pay | Admitting: *Deleted

## 2014-07-16 ENCOUNTER — Encounter: Payer: Self-pay | Admitting: Family Medicine

## 2014-07-16 ENCOUNTER — Emergency Department (INDEPENDENT_AMBULATORY_CARE_PROVIDER_SITE_OTHER)
Admission: EM | Admit: 2014-07-16 | Discharge: 2014-07-16 | Disposition: A | Discharge status: Home / Self Care | Payer: Self-pay | Source: Home / Self Care | Attending: Family Medicine | Admitting: Family Medicine

## 2014-07-16 ENCOUNTER — Telehealth: Payer: Self-pay | Admitting: Family Medicine

## 2014-07-16 ENCOUNTER — Ambulatory Visit (INDEPENDENT_AMBULATORY_CARE_PROVIDER_SITE_OTHER): Payer: Self-pay | Admitting: Family Medicine

## 2014-07-16 VITALS — BP 130/70 | HR 70 | Temp 98.1°F | Ht 68.0 in | Wt 312.1 lb

## 2014-07-16 DIAGNOSIS — K029 Dental caries, unspecified: Secondary | ICD-10-CM

## 2014-07-16 DIAGNOSIS — L270 Generalized skin eruption due to drugs and medicaments taken internally: Secondary | ICD-10-CM | POA: Insufficient documentation

## 2014-07-16 MED ORDER — PREDNISONE 20 MG PO TABS: 20.0000 mg | ORAL_TABLET | Freq: Every day | ORAL | Status: DC

## 2014-07-16 MED ORDER — PREDNISONE 20 MG PO TABS
ORAL_TABLET | ORAL | Status: AC
  Filled 2014-07-16: qty 2

## 2014-07-16 MED ORDER — PREDNISONE 20 MG PO TABS: 40.0000 mg | ORAL_TABLET | Freq: Once | ORAL | Status: DC

## 2014-07-16 NOTE — Telephone Encounter (Signed)
No, messages were checked late.  If already taken care of, thank you.  Derl Barrow, RN

## 2014-07-16 NOTE — Telephone Encounter (Signed)
Routing to Dr. Gwendlyn Deutscher, who wrote prescription.

## 2014-07-16 NOTE — Assessment & Plan Note (Addendum)
Rash is likely due to reaction to clindamycin, although she has been on it for 5 days prior to developing the rash. Bed bug unlikely due to wide spread nature of the rash which worsened in less than 48 hrs. Instruction however given to check her bedding for bugs and clean appropriately if she sees any bug. D/C Clindamycin, she already completed 7 days and dental pain has improved. Oral prednisone prescribed for 10 days. OTC benadryl prn itching. Return precaution discussed. F/U prn.

## 2014-07-16 NOTE — Telephone Encounter (Signed)
Pt called because she schedule to have an MRI done but she can not do this since she is claustrophobic. She would like [place] to schedule another MRI but in a machine that is open and not closed. jw

## 2014-07-16 NOTE — Progress Notes (Signed)
Subjective:     Patient ID: Regina Burns, female   DOB: 1955/05/18, 59 y.o.   MRN: [id number]  HPI Skin rash: Patient here for skin rash all over her body which started 2 days ago. Patient denies contact with person with skin rash, no fever,no change in diet or clothing. She was placed on Clindamycin 7 days ago for dental infection otherwise no change in medication. She also mentioned her mom stays in a home with bed bug infestation but none in her own house. Rash is itchy, she denies any pain.  Current Outpatient Prescriptions on File Prior to Visit  Medication Sig Dispense Refill  . amLODipine (NORVASC) 10 MG tablet TAKE 1 TABLET BY MOUTH EVERY DAY 90 tablet 0  . atenolol-chlorthalidone (TENORETIC) 100-25 MG per tablet TAKE 1 TABLET BY MOUTH EVERY DAY 90 tablet 0  . gabapentin (NEURONTIN) 100 MG capsule Take 1 capsule (100 mg total) by mouth at bedtime. 90 capsule 3  . lovastatin (MEVACOR) 20 MG tablet TAKE 2 TABLETS BY MOUTH AT BEDTIME 90 tablet 11  . trolamine salicylate (ASPERCREME/ALOE) 10 % cream Apply 1 application topically as needed for muscle pain. 85 g 0  . cyclobenzaprine (FLEXERIL) 5 MG tablet Take 1 tablet (5 mg total) by mouth at bedtime as needed for muscle spasms. (Patient not taking: Reported on 07/16/2014) 20 tablet 0  . diclofenac (VOLTAREN) 50 MG EC tablet Take 1 tablet (50 mg total) by mouth 2 (two) times daily as needed. (Patient not taking: Reported on 07/16/2014) 30 tablet 0  . ibuprofen (ADVIL,MOTRIN) 600 MG tablet Take 1 tablet (600 mg total) by mouth every 6 (six) hours as needed. (Patient not taking: Reported on 07/16/2014) 30 tablet 0   No current facility-administered medications on file prior to visit.   Past Medical History  Diagnosis Date  . GERD (gastroesophageal reflux disease) 2005  . Hypertension 2012  . Hyperlipidemia 2012     Review of Systems  Respiratory: Negative.   Cardiovascular: Negative.   Gastrointestinal: Negative.     Genitourinary: Negative.   Skin: Positive for rash.  All other systems reviewed and are negative.      Filed Vitals:   07/16/14 1029  BP: 130/70  Pulse: 70  Temp: 98.1 F (36.7 C)  TempSrc: Oral  Height: 5\' 8"  (1.727 m)  Weight: 312 lb 1 oz (141.551 kg)    Objective:   Physical Exam  Constitutional: She appears well-developed. No distress.  Cardiovascular: Normal rate, regular rhythm and normal heart sounds.   No murmur heard. Pulmonary/Chest: Effort normal and breath sounds normal. No respiratory distress. She has no wheezes.  Skin:     Nursing note and vitals reviewed.      Assessment:     Dermatitis: Likely drug reaction to Clindamycin.     Plan:     Check problem list.

## 2014-07-16 NOTE — Patient Instructions (Signed)
It was nice seeing you today. I am sorry about your rash. Clindamycin does cause skin rash and it could be that you are reacting to it. Since you have already completed 7 days of Clindamycin, I think it is ok for you to stop it now. It may also be bed bug rash but it will be unusual for it to be this widespread. I will start you on oral steroid to help with the itching and redness. Follow up in 1-2 weeks if no improvement or rash is worsening please see [place] back soon.

## 2014-07-16 NOTE — Telephone Encounter (Signed)
Received a message from [place] needing clarification for the quantity of the prednisone.  Please give them a call to verify at [phone].  Derl Barrow, RN

## 2014-07-16 NOTE — ED Notes (Signed)
Pt reports drug reaction to clindamycin... Has developed a rash all over body Sx include swelling of tongue Denies dyspnea, SOB, odynophagia Alert, no signs of acute distress.

## 2014-07-16 NOTE — Telephone Encounter (Signed)
That was taken care of at 11am. Is this a new message?

## 2014-07-16 NOTE — ED Provider Notes (Signed)
Regina Burns is a 59 y.o. female who presents to Urgent Care today for patient notes a pruritic rash on her trunk and extremities starting yesterday evening. She thinks this is due to clindamycin that she's been taking. She stopped taking clindamycin last dose yesterday. She was seen by her primary care provider this morning for this very issue. She was prescribed prednisone but has not yet picked it up. 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 No tongue or lip swelling. No trouble breathing or swallowing. No fevers or chills.   Past Medical History  Diagnosis Date  . GERD (gastroesophageal reflux disease) 2005  . Hypertension 2012  . Hyperlipidemia 2012   Past Surgical History  Procedure Laterality Date  . Appendectomy  1965  . Hernia repair  1965   History  Substance Use Topics  . Smoking status: Former Smoker    Quit date: 01/18/1998  . Smokeless tobacco: Never Used  . Alcohol Use: No   ROS as above Medications: Current Facility-Administered Medications  Medication Dose Route Frequency Provider Last Rate Last Dose  . predniSONE (DELTASONE) tablet 40 mg  40 mg Oral Once Gregor Hams, MD       Current Outpatient Prescriptions  Medication Sig Dispense Refill  . amLODipine (NORVASC) 10 MG tablet TAKE 1 TABLET BY MOUTH EVERY DAY 90 tablet 0  . atenolol-chlorthalidone (TENORETIC) 100-25 MG per tablet TAKE 1 TABLET BY MOUTH EVERY DAY 90 tablet 0  . gabapentin (NEURONTIN) 100 MG capsule Take 1 capsule (100 mg total) by mouth at bedtime. 90 capsule 3  . lovastatin (MEVACOR) 20 MG tablet TAKE 2 TABLETS BY MOUTH AT BEDTIME 90 tablet 11  . predniSONE (DELTASONE) 20 MG tablet Take 1 tablet (20 mg total) by mouth daily with breakfast. 10 tablet 0  . trolamine salicylate (ASPERCREME/ALOE) 10 % cream Apply 1 application topically as needed for muscle pain. 85 g 0   Allergies   Allergen Reactions  . Ace Inhibitors Swelling  . Lisinopril Swelling    Was hospitalized   . Clindamycin/Lincomycin Hives  . Contrast Media [Iodinated Diagnostic Agents] Hives     Exam:  BP 158/80 mmHg  Pulse 79  Temp(Src) 98.5 F (36.9 C) (Oral)  Resp 20  SpO2 96% Gen: Well NAD HEENT: EOMI,  MMM no tongue or lip swelling or oral lesions Lungs: Normal work of breathing. CTABL Heart: RRR no MRG Abd: NABS, Soft. Nondistended, Nontender Exts: Brisk capillary refill, warm and well perfused.  Skin: Flat macular erythematous lesions on trunk and extremities. Blanchable. Nontender.  No results found for this or any previous visit (from the past 24 hour(s)). No results found.  Assessment and Plan: 59 y.o. female with drug rash likely due to clindamycin. Discontinue clindamycin. Give 40 mg by mouth prednisone here. Start taking prednisone prescribed by PCP tomorrow. Return as needed.  Discussed warning signs or symptoms. Please see discharge instructions. Patient expresses understanding.     Gregor Hams, MD 07/16/14 [phone]

## 2014-07-16 NOTE — Discharge Instructions (Signed)
Thank you for coming in today. STOP clindamycin.  Start taking prednisone tomorrow.  Go to the ER if you get worse.  Call or go to the emergency room if you get worse, have trouble breathing, have chest pains, or palpitations.    Drug Allergy Allergic reactions to medicines are common. Some allergic reactions are mild. A delayed type of drug allergy that occurs 1 week or more after exposure to a medicine or vaccine is called serum sickness. A life-threatening, sudden (acute) allergic reaction that involves the whole body is called anaphylaxis. CAUSES  "True" drug allergies occur when there is an allergic reaction to a medicine. This is caused by overactivity of the immune system. First, the body becomes sensitized. The immune system is triggered by your first exposure to the medicine. Following this first exposure, future exposure to the same medicine may be life-threatening. Almost any medicine can cause an allergic reaction. Common ones are:  Penicillin.  Sulfonamides (sulfa drugs).  Local anesthetics.  X-ray dyes that contain iodine. SYMPTOMS  Common symptoms of a minor allergic reaction are:  Swelling around the mouth.  An itchy red rash or hives.  Vomiting or diarrhea. Anaphylaxis can cause swelling of the mouth and throat. This makes it difficult to breathe and swallow. Severe reactions can be fatal within seconds, even after exposure to only a trace amount of the drug that causes the reaction. HOME CARE INSTRUCTIONS   If you are unsure of what caused your reaction, keep a diary of foods and medicines used. Include the symptoms that followed. Avoid anything that causes reactions.  You may want to follow up with an allergy specialist after the reaction has cleared in order to be tested to confirm the allergy. It is important to confirm that your reaction is an allergy, not just a side effect to the medicine. If you have a true allergy to a medicine, this may prevent that  medicine and related medicines from being given to you when you are very ill.  If you have hives or a rash:  Take medicines as directed by your caregiver.  You may use an over-the-counter antihistamine (diphenhydramine) as needed.  Apply cold compresses to the skin or take baths in cool water. Avoid hot baths or showers.  If you are severely allergic:  Continuous observation after a severe reaction may be needed. Hospitalization is often required.  Wear a medical alert bracelet or necklace stating your allergy.  You and your family must learn how to use an anaphylaxis kit or give an epinephrine injection to temporarily treat an emergency allergic reaction. If you have had a severe reaction, always carry your epinephrine injection or anaphylaxis kit with you. This can be lifesaving if you have a severe reaction.  Do not drive or perform tasks after treatment until the medicines used to treat your reaction have worn off, or until your caregiver says it is okay. SEEK MEDICAL CARE IF:   You think you had an allergic reaction. Symptoms usually start within 30 minutes after exposure.  Symptoms are getting worse rather than better.  You develop new symptoms.  The symptoms that brought you to your caregiver return. SEEK IMMEDIATE MEDICAL CARE IF:   You have swelling of the mouth, difficulty breathing, or wheezing.  You have a tight feeling in your chest or throat.  You develop hives, swelling, or itching all over your body.  You develop severe vomiting or diarrhea.  You feel faint or pass out. This is an emergency.  Use your epinephrine injection or anaphylaxis kit as you have been instructed. Call for emergency medical help. Even if you improve after the injection, you need to be examined at a hospital emergency department. MAKE SURE YOU:   Understand these instructions.  Will watch your condition.  Will get help right away if you are not doing well or get worse. Document  Released: 01/04/2005 Document Revised: 03/29/2011 Document Reviewed: 06/10/2010 [facility] Patient Information 2015 [place], [place]. This information is not intended to replace advice given to you by your health care provider. Make sure you discuss any questions you have with your health care provider.

## 2014-07-17 NOTE — Telephone Encounter (Signed)
Will forward to [place] to see if this could be changed.  Jazmin Hartsell,CMA

## 2014-07-17 NOTE — Telephone Encounter (Signed)
Started prednisone yesterdayfor allergic reaction to a medication Hives are still spreading. Is this normarl? What can she do for the itching? Please advise

## 2014-07-18 ENCOUNTER — Telehealth: Payer: Self-pay | Admitting: Family Medicine

## 2014-07-18 NOTE — Telephone Encounter (Signed)
Spoke with patient regarding hives.  Pt stated she has taken at least 2-3 doses of the prednisone to help with the hives, but they were still itching really bad. Advised patient to continue with the prednisone until completely.  It will take a couple of days to see if they prednisone helps.  As for the itching she could use benadryl cream/lotion or hydrocortisone cream.  Will forward to PCP.  Derl Barrow, RN

## 2014-07-18 NOTE — Telephone Encounter (Signed)
Patient called after hours number reporting she is still concerned about her rash that doesn't seem to be improving even after she has stopped the clindamycin and started prednisone. She denies trouble breathing or swallowing and no swelling.  - Advised continuing prednisone - Recommending re-evaluation in clinic if not improving by Monday, or going to ED if develops trouble breathing, swallowing or facial edema

## 2014-07-19 ENCOUNTER — Encounter: Payer: Self-pay | Admitting: Family Medicine

## 2014-07-19 ENCOUNTER — Ambulatory Visit (INDEPENDENT_AMBULATORY_CARE_PROVIDER_SITE_OTHER): Payer: Self-pay | Admitting: Family Medicine

## 2014-07-19 VITALS — BP 156/76 | HR 87 | Temp 98.3°F | Ht 68.0 in | Wt 310.7 lb

## 2014-07-19 DIAGNOSIS — L270 Generalized skin eruption due to drugs and medicaments taken internally: Secondary | ICD-10-CM

## 2014-07-19 MED ORDER — PREDNISONE 20 MG PO TABS: 20.0000 mg | ORAL_TABLET | Freq: Every day | ORAL | Status: AC

## 2014-07-19 NOTE — Progress Notes (Signed)
   Subjective:    Patient ID: Regina Burns, female    DOB: July 04, 1955, 59 y.o.   MRN: [id number]  Patient presents for a same day appointment.  HPI  RASH / ALLERGIC REACTION: - Last seen at [facility] for same complaint 6/28 and went to [facility] on 6/28, suspected to be drug eruption allergic reaction to Clindamycin antibiotic started on 6/21 for dental infection. Initially rash started on night of 6th day of treatment (approx 6/26), described as red raised itching non-painful rash on chest, with spreading and coalescing across arms, legs, and back. Denied any other exposures or sick contacts. Treated with Prednisone 20mg  daily x 10 days, with mild relief. Has taken 4 doses, and thinks raised part of rash is improved. - Today patient presents for follow-up and to have the rash looked at again. She states mild improvement, rash seems less raised but still red and still extensive. Continues to itch, but denies any pain. No new concerns. Remains off Clindamycin. Admits similar history of rash from medication years ago, but not this bad. - Tried topical benadryl and PO, ice PRN - Denies any fevers/chills, nausea, vomiting, tongue or facial edema, wheezing, coughing, shortness of breath, chest pain, blistering, ulceration, bleeding, drainage, warmth or painful rash  I have reviewed and updated the following as appropriate: allergies and current medications  Social Hx: - Former smoker  Review of Systems  See above HPI    Objective:   Physical Exam  BP 156/76 mmHg  Pulse 87  Temp(Src) 98.3 F (36.8 C) (Oral)  Ht 5\' 8"  (1.727 m)  Wt 310 lb 11.2 oz (140.933 kg)  BMI 47.25 kg/m2  Gen - well-appearing, pleasant, comfortable, NAD HEENT - sclera clear, patent nares w/o congestion, oropharynx clear without mucosal involvement, no oral/lip edema, MMM Neck - supple, non-tender, no LAD Heart - RRR, no murmurs heard Lungs - CTAB, no wheezing, crackles, or rhonchi. Normal work of breathing. Ext -  non-tender, no edema, peripheral pulses intact +2 b/l Skin - warm, dry. Extensive patches of erythematous maculopapular rash (mildly raised) blanches consistent with coalesced urticaria covering bilateral upper arms, thighs, some on back, resolving on neck and chest. Non-tender, no blistering or ulcerations, no bleeding or drainage, no induration. See pictures below Neuro - awake, alert, oriented          Assessment & Plan:   See specific A&P problem list for details.

## 2014-07-19 NOTE — Patient Instructions (Signed)
Dear Regina Burns, Thank you for coming in to clinic today.  1. Your rash is very consistent with a Drug / Medication Reaction - This will take time to fully resolve. 2. It sounds like you are starting to respond to the Prednisone. Keep taking one tablet daily with breakfast, finish your total 10 day course. If needed, you may go fill this additional 1 week course, and take one tablet daily. 3. Keep trying topical therapy, ice as needed. To reduce itching. Try your best to avoid scratching to reduce risk of infection  Reassurance today that you do not have any fevers, throat swelling, breathing difficulties, or peeling / blistering rash. These are the warning signs to look out for, if you develop these and if rash worsens instead of gradually improving over next 2 to 3 weeks. Please call or follow-up in clinic, if emergency you may go to ED.  Please schedule a follow-up appointment with Dr. Gerlean Ren in 2 to 4 weeks for follow-up as scheduled  If you have any other questions or concerns, please feel free to call the clinic to contact me. You may also schedule an earlier appointment if necessary.  However, if your symptoms get significantly worse, please go to the Emergency Department to seek immediate medical attention.  Nobie Putnam, DO [facility]   Drug Allergy Allergic reactions to medicines are common. Some allergic reactions are mild. A delayed type of drug allergy that occurs 1 week or more after exposure to a medicine or vaccine is called serum sickness. A life-threatening, sudden (acute) allergic reaction that involves the whole body is called anaphylaxis. CAUSES  "True" drug allergies occur when there is an allergic reaction to a medicine. This is caused by overactivity of the immune system. First, the body becomes sensitized. The immune system is triggered by your first exposure to the medicine. Following this first exposure, future exposure to the same  medicine may be life-threatening. Almost any medicine can cause an allergic reaction. Common ones are:  Penicillin.  Sulfonamides (sulfa drugs).  Local anesthetics.  X-ray dyes that contain iodine. SYMPTOMS  Common symptoms of a minor allergic reaction are:  Swelling around the mouth.  An itchy red rash or hives.  Vomiting or diarrhea. Anaphylaxis can cause swelling of the mouth and throat. This makes it difficult to breathe and swallow. Severe reactions can be fatal within seconds, even after exposure to only a trace amount of the drug that causes the reaction. HOME CARE INSTRUCTIONS   If you are unsure of what caused your reaction, keep a diary of foods and medicines used. Include the symptoms that followed. Avoid anything that causes reactions.  You may want to follow up with an allergy specialist after the reaction has cleared in order to be tested to confirm the allergy. It is important to confirm that your reaction is an allergy, not just a side effect to the medicine. If you have a true allergy to a medicine, this may prevent that medicine and related medicines from being given to you when you are very ill.  If you have hives or a rash:  Take medicines as directed by your caregiver.  You may use an over-the-counter antihistamine (diphenhydramine) as needed.  Apply cold compresses to the skin or take baths in cool water. Avoid hot baths or showers.  If you are severely allergic:  Continuous observation after a severe reaction may be needed. Hospitalization is often required.  Wear a medical alert bracelet or necklace  stating your allergy.  You and your family must learn how to use an anaphylaxis kit or give an epinephrine injection to temporarily treat an emergency allergic reaction. If you have had a severe reaction, always carry your epinephrine injection or anaphylaxis kit with you. This can be lifesaving if you have a severe reaction.  Do not drive or perform tasks  after treatment until the medicines used to treat your reaction have worn off, or until your caregiver says it is okay. SEEK MEDICAL CARE IF:   You think you had an allergic reaction. Symptoms usually start within 30 minutes after exposure.  Symptoms are getting worse rather than better.  You develop new symptoms.  The symptoms that brought you to your caregiver return. SEEK IMMEDIATE MEDICAL CARE IF:   You have swelling of the mouth, difficulty breathing, or wheezing.  You have a tight feeling in your chest or throat.  You develop hives, swelling, or itching all over your body.  You develop severe vomiting or diarrhea.  You feel faint or pass out. This is an emergency. Use your epinephrine injection or anaphylaxis kit as you have been instructed. Call for emergency medical help. Even if you improve after the injection, you need to be examined at a hospital emergency department. MAKE SURE YOU:   Understand these instructions.  Will watch your condition.  Will get help right away if you are not doing well or get worse. Document Released: 01/04/2005 Document Revised: 03/29/2011 Document Reviewed: 06/10/2010 [facility] Patient Information 2015 [place], [place]. This information is not intended to replace advice given to you by your health care provider. Make sure you discuss any questions you have with your health care provider.

## 2014-07-19 NOTE — Assessment & Plan Note (Signed)
Stable to mildly improved drug eruption rash, suspected due to recent Clindamycin (6/21-6/26) since discontinued. - No facial/oral edema, no airway compromise, wheezing or SOB (duration >1 week unlikely anaphylaxis) - Afebrile, non-tender, no blistering/ulceration, no mucosal involvement. No evidence for infection or progression to SJS/TEN. Not consistent with DRESS syndrome (< 90% BSA, afebrile, no systemic symptoms).  Plan: 1. Reassurance, may take up to 2 to 6 weeks after discontinuing Clindamycin to fully resolve 2. Continue current Prednisone 20mg  daily x 10 days, also printed additionally 7 day rx Prednisone 20mg  to have on hand incase improving but not resolving, per pt request may fill optional, otherwise don't take if improved 3. Continue topical OTC anti-itch therapies PRN 4. Return criteria given. If develops fevers, systemic can consider CMET for kidney liver function, less likely

## 2014-08-07 ENCOUNTER — Ambulatory Visit
Admission: RE | Admit: 2014-08-07 | Discharge: 2014-08-07 | Disposition: A | Discharge status: Home / Self Care | Payer: No Typology Code available for payment source | Source: Ambulatory Visit | Attending: Family Medicine | Admitting: Family Medicine

## 2014-08-08 ENCOUNTER — Ambulatory Visit
Admission: RE | Admit: 2014-08-08 | Discharge: 2014-08-08 | Disposition: A | Discharge status: Home / Self Care | Payer: No Typology Code available for payment source | Source: Ambulatory Visit | Attending: Family Medicine | Admitting: Family Medicine

## 2014-08-08 ENCOUNTER — Other Ambulatory Visit: Payer: Self-pay | Admitting: Family Medicine

## 2014-08-08 ENCOUNTER — Other Ambulatory Visit: Payer: No Typology Code available for payment source

## 2014-08-08 DIAGNOSIS — M25562 Pain in left knee: Secondary | ICD-10-CM

## 2014-08-20 ENCOUNTER — Telehealth: Payer: Self-pay | Admitting: Family Medicine

## 2014-08-27 ENCOUNTER — Ambulatory Visit (INDEPENDENT_AMBULATORY_CARE_PROVIDER_SITE_OTHER): Payer: Self-pay | Admitting: Family Medicine

## 2014-08-27 VITALS — BP 156/72 | HR 67 | Temp 97.9°F | Ht 68.0 in | Wt 314.3 lb

## 2014-08-27 DIAGNOSIS — S83207D Unspecified tear of unspecified meniscus, current injury, left knee, subsequent encounter: Secondary | ICD-10-CM

## 2014-08-27 DIAGNOSIS — S838X2A Sprain of other specified parts of left knee, initial encounter: Secondary | ICD-10-CM

## 2014-08-27 DIAGNOSIS — S8392XA Sprain of unspecified site of left knee, initial encounter: Secondary | ICD-10-CM

## 2014-08-27 NOTE — Progress Notes (Signed)
Subjective:     Patient ID: Regina Burns, female   DOB: [birth year], 59 y.o.   MRN: [id number]  HPI Regina Burns is a 59yo female presenting to have form filled out. - Discussed MRI results which showed medial and lateral meniscal tears of left knee - States pain is improved but notes occasional flares. Recently took course of prednisone for allergic reaction and this seemed to significantly improve pain - Has been using ibuprofen occasionally, which helps with pain - Has form for medical evaluation by DMV to ensure she is still capable of driving safely  Review of Systems  Musculoskeletal: Positive for arthralgias and gait problem.       Objective:   Physical Exam  Constitutional: She appears well-developed and well-nourished. No distress.  HENT:  Head: Normocephalic and atraumatic.  Cardiovascular: Normal rate and regular rhythm.  Exam reveals no gallop and no friction rub.   No murmur heard. Pulmonary/Chest: Effort normal. No respiratory distress. She has no wheezes. She has no rales.  Abdominal: Soft. She exhibits no distension. There is no tenderness.  Musculoskeletal:  Some tenderness over left knee over medial and lateral components. Crepitus noted bilaterally.       Assessment:     Please refer to Problem List for Assessment.    Plan:     Please refer to Problem List for Plan.

## 2014-08-27 NOTE — Patient Instructions (Signed)

## 2014-08-28 DIAGNOSIS — S83207A Unspecified tear of unspecified meniscus, current injury, left knee, initial encounter: Secondary | ICD-10-CM | POA: Insufficient documentation

## 2014-08-28 NOTE — Assessment & Plan Note (Signed)
-   Continue ibuprofen as needed for acute flares - Will fill out form and return - PT order placed for conservative treatment of meniscal tear. If no improvement in pain, may need orthopedics referral for surgery.

## 2014-09-03 ENCOUNTER — Telehealth: Payer: Self-pay | Admitting: Family Medicine

## 2014-09-03 NOTE — Telephone Encounter (Signed)
Pt called about status of DMV physical form she left at visit on August 9 Please advise

## 2014-09-11 ENCOUNTER — Ambulatory Visit: Payer: No Typology Code available for payment source | Attending: Family Medicine | Admitting: Physical Therapy

## 2014-09-11 DIAGNOSIS — R29898 Other symptoms and signs involving the musculoskeletal system: Secondary | ICD-10-CM | POA: Insufficient documentation

## 2014-09-11 DIAGNOSIS — M25562 Pain in left knee: Secondary | ICD-10-CM | POA: Insufficient documentation

## 2014-09-11 DIAGNOSIS — R269 Unspecified abnormalities of gait and mobility: Secondary | ICD-10-CM | POA: Insufficient documentation

## 2014-09-11 DIAGNOSIS — M25662 Stiffness of left knee, not elsewhere classified: Secondary | ICD-10-CM

## 2014-09-11 NOTE — Therapy (Signed)
[place], [place], [address] Phone: [phone]   Fax:  [phone]  Physical Therapy Evaluation  Patient Details  Name: Regina Burns MRN: [id number] Date of Birth: 1955-05-09 Referring Provider:  Lorna Few, DO  Encounter Date: 09/11/2014      PT End of Session - 09/11/14 0957    Visit Number 1   Number of Visits 12   Date for PT Re-Evaluation 10/23/14   PT Start Time 0845   PT Stop Time 0930   PT Time Calculation (min) 45 min   Activity Tolerance Patient tolerated treatment well   Behavior During Therapy [facility] for tasks assessed/performed      Past Medical History  Diagnosis Date  . GERD (gastroesophageal reflux disease) 2005  . Hypertension 2012  . Hyperlipidemia 2012    Past Surgical History  Procedure Laterality Date  . Appendectomy  1965  . Hernia repair  1965    There were no vitals filed for this visit.  Visit Diagnosis:  Left knee pain - Plan: PT plan of care cert/re-cert  Left leg weakness - Plan: PT plan of care cert/re-cert  Abnormality of gait - Plan: PT plan of care cert/re-cert  Decreased ROM of left knee - Plan: PT plan of care cert/re-cert      Subjective Assessment - 09/11/14 0900    Subjective pt is a 59 y.o F with CC of left knee pain that started 6 months ago due to falling landing on her left knee. She reports initially that it wasn't bad and thought she was fine and the pain gradually got worse, with the knee giving, popping and clicking and bending.    Limitations Sitting;Lifting;Standing;Walking;House hold activities   How long can you sit comfortably? 15-20 min   How long can you stand comfortably? 15-20 min   How long can you walk comfortably? 15-20 min   Diagnostic tests 08/08/2014 Radial tear of posterior horn of the medial meniscus with peripheral meniscal extrusion.   Patient Stated Goals to get back to walking wihtout pain or damaging the  knee.    Currently in Pain? Yes   Pain Score 8    Pain Location Knee   Pain Orientation Left   Pain Descriptors / Indicators Dull;Sore   Pain Type Chronic pain   Pain Radiating Towards to mid to lower shin on L   Pain Onset More than a month ago   Pain Frequency Constant   Aggravating Factors  standing, laying down for long periods of time, stairs,   Pain Relieving Factors ibuprofen, rubbing, ointment   Effect of Pain on Daily Activities limited stability, endurnace, strength            [facility] PT Assessment - 09/11/14 0908    Assessment   Medical Diagnosis Left meniscus tear   Onset Date/Surgical Date --  pt reported April 2016   Hand Dominance Right   Next MD Visit make one as PRN   Prior Therapy yes  neck   Precautions   Precautions None   Restrictions   Weight Bearing Restrictions No   Balance Screen   Has the patient fallen in the past 6 months Yes   How many times? 1   Has the patient had a decrease in activity level because of a fear of falling?  No   Is the patient reluctant to leave their home because of a fear of falling?  No   Home Environment   Living  Environment Private residence   Living Arrangements Children;Parent   Available Help at Discharge Available PRN/intermittently;Available 24 hours/day   Type of Home House   Home Access Level entry   Home Layout One level   Prior Function   Level of Independence Independent with basic ADLs   Vocation Unemployed   Leisure walking, reading, writing poetry,    Cognition   Overall Cognitive Status Within Functional Limits for tasks assessed   Observation/Other Assessments   Focus on Therapeutic Outcomes (FOTO)  75% limited  predicted 51% limited   Posture/Postural Control   Posture/Postural Control Postural limitations   Posture Comments forward head and rounded shoulders   ROM / Strength   AROM / PROM / Strength AROM;Strength;PROM   AROM   AROM Assessment Site Knee   Right/Left Knee Right;Left   Right  Knee Extension 0   Right Knee Flexion 131   Left Knee Extension 0   Left Knee Flexion 103  pain at end range   PROM   PROM Assessment Site Knee   Right/Left Knee Right;Left   Left Knee Extension 0   Left Knee Flexion 112  pain at end range   Strength   Strength Assessment Site Knee   Right/Left Knee Left;Right   Right Knee Flexion 4+/5   Right Knee Extension 5/5   Left Knee Flexion 3+/5  pain during testing   Left Knee Extension 3+/5  pain during testing   Palpation   Palpation comment tenderness located peripatellar with incrased soreness at the inferior aspect of the patella/ knee   Special Tests    Special Tests Knee Special Tests;Meniscus Tests   Meniscus Tests McMurray Test   McMurray Test   Findings Positive   Side Left   Ambulation/Gait   Gait Pattern Step-to pattern;Decreased step length - right;Decreased step length - left;Decreased stance time - left;Shuffle;Antalgic                           PT Education - 09/11/14 [phone]    Education provided Yes   Education Details evaluation findings, POC, goals, HEP   Person(s) Educated Patient   Methods Explanation   Comprehension Verbalized understanding          PT Short Term Goals - 09/11/14 1008    PT SHORT TERM GOAL #1   Title pt will be I with initial HEP (10/02/2014)   Time 3   Period Weeks   Status New   PT SHORT TERM GOAL #2   Title pt will be able to tolerated standing and walking for >10 minutes with < 4/10 pain to assist with functional progression (10/02/2014)   Time 3   Period Weeks   Status New   PT SHORT TERM GOAL #3   Title pt will increase her FOTO score by > 10 points to help with increased funcitonal capabilities (10/02/2014)   Time 3   Period Weeks   Status New           PT Long Term Goals - 09/11/14 1011    PT LONG TERM GOAL #1   Title Independent with all HEP issued as of last visit   Baseline FROM PREVOUS DISCHARGED EPISODE   Time 4   Period Weeks   Status  Achieved   PT LONG TERM GOAL #2   Title She will report no pain with normal activity   Baseline FROM PREVOUS DISCHARGED EPISODE   Time 4   Period Weeks  Status Achieved   PT LONG TERM GOAL #3   Title she will be able to assume good posture   Baseline FROM PREVOUS DISCHARGED EPISODE   Time 4   Period Weeks   Status Achieved   PT LONG TERM GOAL #4   Title At discharge pt will be I will all HEP given throughout therapy  (10/23/2014)   Time 6   Period Weeks   Status New   PT LONG TERM GOAL #5   Title pt will increase L knee AROM to >120 degrees to assist with functional and efficent gait pattern (10/23/2014)   Time 6   Period Weeks   Status New   Additional Long Term Goals   Additional Long Term Goals Yes   PT LONG TERM GOAL #6   Title She will increase strength in the L knee to > 4+/5 to assist with prolong standing and walking activities  to assist with possible new job activities and ADLs (10/23/2014)   Time 6   Period Weeks   Status New   PT LONG TERM GOAL #7   Title She will be able to walk/stand > 30 min and navigate > 10 steps with < 2/10 pain to assist with community ambulation and endurance (10/23/2014)   Time 6   Period Weeks   Status New   PT LONG TERM GOAL #8   Title pt will increase her FOTO score to atleast 49 to demonstrate improved functional capabilities at discharge (10/23/2014)   Time [place] - 09/11/14 0958    Clinical Impression Statement Regina Burns presents to OPPT with CC of L knee pain that started in April with pain gradually getting worse. She demonstrates limited knee AORM and strength with pain during testing. She exhibited positive mcmurrys testing. She exhibits an antalgic gait  pattern with shuflfing and limited stance on the L and decreased step length of  RLE. She demonstrates tenderness located peripatellar with increased tenderness located at  inferior patellar region. She would benefit from skilled  physical therpay to maxmize her function by addressing the impairments listed.    Pt will benefit from skilled therapeutic intervention in order to improve on the following deficits Decreased activity tolerance;Decreased balance;Decreased endurance;Decreased range of motion;Increased edema;Decreased strength;Decreased mobility;Pain;Obesity;Hypomobility;Decreased safety awareness   Rehab Potential Good   PT Frequency 2x / week   PT Duration 6 weeks   PT Treatment/Interventions ADLs/Self Care Home Management;Cryotherapy;Electrical Stimulation;Iontophoresis 4mg /ml Dexamethasone;Moist Heat;Ultrasound;Therapeutic activities;Therapeutic exercise;Patient/family education;Manual techniques;Taping;Dry needling;Passive range of motion;Gait training   PT Next Visit Plan assess response to HEP, knee mobility, strengthening, modalities PRN, Soft tissue work   PT Home Exercise Plan see HEP handout   Consulted and Agree with Plan of Care Patient         Problem List Patient Active Problem List   Diagnosis Date Noted  . Acute meniscal tear of left knee 08/28/2014  . Dermatitis due to drug reaction 07/16/2014  . Dental infection 07/10/2014  . Left knee pain 06/08/2014  . Restless leg syndrome 06/08/2014  . Caregiver stress 12/25/2013  . SLEEP APNEA 12/31/2009  . LEG EDEMA, BILATERAL 12/31/2009  . FREQUENCY, URINARY 08/25/2009  . HYPERTENSION, BENIGN ESSENTIAL 08/09/2006  . SYMPTOM, DIZZINESS AND GIDDINESS 08/09/2006  . OBESITY, NOS 03/17/2006  . GASTROESOPHAGEAL REFLUX, NO ESOPHAGITIS 03/17/2006  . MENOPAUSAL SYNDROME 03/17/2006   Starr Lake PT, DPT, LAT, ATC  09/11/2014  11:06 AM      [place], [place], [address] Phone: [phone]   Fax:  [phone]

## 2014-09-11 NOTE — Patient Instructions (Signed)
   Staci Dack PT, DPT, LAT, ATC  [facility] Phone: [phone]     

## 2014-09-25 ENCOUNTER — Ambulatory Visit: Payer: No Typology Code available for payment source | Attending: Family Medicine | Admitting: Physical Therapy

## 2014-09-25 DIAGNOSIS — M25512 Pain in left shoulder: Secondary | ICD-10-CM | POA: Insufficient documentation

## 2014-09-25 DIAGNOSIS — R29898 Other symptoms and signs involving the musculoskeletal system: Secondary | ICD-10-CM | POA: Insufficient documentation

## 2014-09-25 DIAGNOSIS — R269 Unspecified abnormalities of gait and mobility: Secondary | ICD-10-CM | POA: Insufficient documentation

## 2014-09-25 DIAGNOSIS — M62838 Other muscle spasm: Secondary | ICD-10-CM | POA: Insufficient documentation

## 2014-09-25 DIAGNOSIS — M542 Cervicalgia: Secondary | ICD-10-CM | POA: Insufficient documentation

## 2014-09-25 DIAGNOSIS — M25562 Pain in left knee: Secondary | ICD-10-CM | POA: Insufficient documentation

## 2014-09-25 DIAGNOSIS — M25662 Stiffness of left knee, not elsewhere classified: Secondary | ICD-10-CM

## 2014-09-25 NOTE — Therapy (Signed)
[place], [place], [address] Phone: [phone]   Fax:  [phone]  Physical Therapy Treatment  Patient Details  Name: Regina Burns MRN: [id number] Date of Birth: [birth year] Referring Provider:  Lorna Few, DO  Encounter Date: 09/25/2014      PT End of Session - 09/25/14 1430    Visit Number 2   Number of Visits 12   Date for PT Re-Evaluation 10/23/14   PT Start Time 1330   PT Stop Time 1428   PT Time Calculation (min) 58 min   Activity Tolerance Patient tolerated treatment well   Behavior During Therapy [facility] for tasks assessed/performed      Past Medical History  Diagnosis Date  . GERD (gastroesophageal reflux disease) 2005  . Hypertension 2012  . Hyperlipidemia 2012    Past Surgical History  Procedure Laterality Date  . Appendectomy  1965  . Hernia repair  1965    There were no vitals filed for this visit.  Visit Diagnosis:  Left knee pain  Left leg weakness  Abnormality of gait  Decreased ROM of left knee      Subjective Assessment - 09/25/14 1429    Subjective "I've been sore the last couple of days and Im not sure why, I didn't really do my exercise"   Currently in Pain? Yes   Pain Score 5    Pain Location Knee   Pain Orientation Left   Pain Descriptors / Indicators Aching;Sore   Pain Type Chronic pain   Pain Onset More than a month ago   Pain Frequency Constant   Aggravating Factors  standing, laying down for long periods of time, steps   Pain Relieving Factors ibuprofen                         [facility] Adult PT Treatment/Exercise - 09/25/14 1339    Knee/Hip Exercises: Stretches   Passive Hamstring Stretch Left;2 reps;30 seconds   Gastroc Stretch Left;2 reps;30 seconds   Soleus Stretch Both;30 seconds   Knee/Hip Exercises: Aerobic   Nustep L7 x 10 min   Knee/Hip Exercises: Seated   Sit to Sand 1 set;10 reps;without UE support  VC to  control descent and to avoid using hands   Knee/Hip Exercises: Supine   Short Arc Quad Sets AROM;Strengthening;Left;2 sets;10 reps   Bridges AROM;Strengthening;Both;1 set;10 reps   Straight Leg Raises AROM;Left;1 set  8 reps with quad set   Other Supine Knee/Hip Exercises Clam shells 1 x 10 with green theraband   Modalities   Modalities Electrical Stimulation   Moist Heat Therapy   Number Minutes Moist Heat 10 Minutes   Moist Heat Location Knee  in supine   Electrical Stimulation   Electrical Stimulation Location Left knee   Electrical Stimulation Action IFC   Electrical Stimulation Parameters 10 min, 100% scan, Level 7    Electrical Stimulation Goals Pain   Manual Therapy   Manual Therapy Joint mobilization   Joint Mobilization grade 2-3 P<>A tibiofemoral mobs                PT Education - 09/25/14 1430    Education provided Yes   Education Details HEP review   Person(s) Educated Patient   Methods Explanation   Comprehension Verbalized understanding          PT Short Term Goals - 09/25/14 1824    PT SHORT TERM GOAL #1   Title pt will  be I with initial HEP (10/02/2014)   Time 3   Period Weeks   Status On-going   PT SHORT TERM GOAL #2   Title pt will be able to tolerated standing and walking for >10 minutes with < 4/10 pain to assist with functional progression (10/02/2014)   Time 3   Period Weeks   Status On-going   PT SHORT TERM GOAL #3   Title pt will increase her FOTO score by > 10 points to help with increased funcitonal capabilities (10/02/2014)   Time 3   Status On-going           PT Long Term Goals - 09/25/14 1824    PT LONG TERM GOAL #4   Title At discharge pt will be I will all HEP given throughout therapy  (10/23/2014)   Time 6   Period Weeks   Status On-going   PT LONG TERM GOAL #5   Title pt will increase L knee AROM to >120 degrees to assist with functional and efficent gait pattern (10/23/2014)   Time 6   Period Weeks   Status  On-going   PT LONG TERM GOAL #6   Title She will increase strength in the L knee to > 4+/5 to assist with prolong standing and walking activities  to assist with possible new job activities and ADLs (10/23/2014)   Time 6   Period Weeks   Status On-going   PT LONG TERM GOAL #7   Title She will be able to walk/stand > 30 min and navigate > 10 steps with < 2/10 pain to assist with community ambulation and endurance (10/23/2014)   Time 6   Period Weeks   Status On-going   PT LONG TERM GOAL #8   Title pt will increase her FOTO score to atleast 49 to demonstrate improved functional capabilities at discharge (10/23/2014)   Time 6   Period Weeks   Status On-going               Plan - 09/25/14 1822    Clinical Impression Statement Regina Burns continues to report pain inthe knee which fluctuates depending on what she is doing. She rpeorts being consistent with her HEP. She was able to complete all exercises today with on mild report of pain. Following exercises she reported some relief of pain with heat and e-stim   PT Next Visit Plan  knee mobility, strengthening, modalities PRN, Soft tissue work        Problem List Patient Active Problem List   Diagnosis Date Noted  . Acute meniscal tear of left knee 08/28/2014  . Dermatitis due to drug reaction 07/16/2014  . Dental infection 07/10/2014  . Left knee pain 06/08/2014  . Restless leg syndrome 06/08/2014  . Caregiver stress 12/25/2013  . SLEEP APNEA 12/31/2009  . LEG EDEMA, BILATERAL 12/31/2009  . FREQUENCY, URINARY 08/25/2009  . HYPERTENSION, BENIGN ESSENTIAL 08/09/2006  . SYMPTOM, DIZZINESS AND GIDDINESS 08/09/2006  . OBESITY, NOS 03/17/2006  . GASTROESOPHAGEAL REFLUX, NO ESOPHAGITIS 03/17/2006  . MENOPAUSAL SYNDROME 03/17/2006   Starr Lake PT, DPT, LAT, ATC  09/25/2014  6:25 PM      [place] [facility] [address] [place], [place], [address] Phone: [phone]   Fax:   [phone]

## 2014-10-01 ENCOUNTER — Ambulatory Visit: Payer: No Typology Code available for payment source | Admitting: Physical Therapy

## 2014-10-01 DIAGNOSIS — M25562 Pain in left knee: Secondary | ICD-10-CM

## 2014-10-01 DIAGNOSIS — M542 Cervicalgia: Secondary | ICD-10-CM

## 2014-10-01 DIAGNOSIS — R29898 Other symptoms and signs involving the musculoskeletal system: Secondary | ICD-10-CM

## 2014-10-01 DIAGNOSIS — M62838 Other muscle spasm: Secondary | ICD-10-CM

## 2014-10-01 DIAGNOSIS — M25512 Pain in left shoulder: Secondary | ICD-10-CM

## 2014-10-01 DIAGNOSIS — M25662 Stiffness of left knee, not elsewhere classified: Secondary | ICD-10-CM

## 2014-10-01 DIAGNOSIS — R269 Unspecified abnormalities of gait and mobility: Secondary | ICD-10-CM

## 2014-10-01 NOTE — Therapy (Signed)
[place], [place], [address] Phone: [phone]   Fax:  [phone]  Physical Therapy Treatment  Patient Details  Name: Regina Burns MRN: [id number] Date of Birth: 06-13-1955 Referring Provider:  Lorna Few, DO  Encounter Date: 10/01/2014      PT End of Session - 10/01/14 1443    Visit Number 3   Number of Visits 12   Date for PT Re-Evaluation 10/23/14   PT Start Time 0226   PT Stop Time 0259   PT Time Calculation (min) 33 min      Past Medical History  Diagnosis Date  . GERD (gastroesophageal reflux disease) 2005  . Hypertension 2012  . Hyperlipidemia 2012    Past Surgical History  Procedure Laterality Date  . Appendectomy  1965  . Hernia repair  1965    There were no vitals filed for this visit.  Visit Diagnosis:  Left knee pain  Left leg weakness  Abnormality of gait  Decreased ROM of left knee  Muscle spasm of left shoulder  Nonspecific pain in the neck region  Pain, joint, shoulder, left      Subjective Assessment - 10/01/14 1428    Subjective I did better with my exercises   Currently in Pain? Yes   Pain Score 3    Pain Location Knee   Pain Orientation Left   Aggravating Factors  sit  to stand transfers, bending after being straight    Pain Relieving Factors ibuprofen, quad sets            [facility] PT Assessment - 10/01/14 1453    AROM   Left Knee Extension 0   Left Knee Flexion 108                     [facility] Adult PT Treatment/Exercise - 10/01/14 1449    Knee/Hip Exercises: Seated   Long Arc Quad 15 reps   Long Arc Quad Weight 3 lbs.   Hamstring Curl Left;15 reps   Hamstring Limitations green band    Knee/Hip Exercises: Supine   Short Arc Quad Sets Left;15 reps   Short Arc Quad Sets Limitations 5   Straight Leg Raises AROM;Both;10 reps                  PT Short Term Goals - 10/01/14 1442    PT SHORT TERM GOAL  #1   Title pt will be I with initial HEP (10/02/2014)   Time 3   Period Weeks   Status On-going   PT SHORT TERM GOAL #2   Title pt will be able to tolerated standing and walking for >10 minutes with < 4/10 pain to assist with functional progression (10/02/2014)   Time 3   Period Weeks   Status On-going   PT SHORT TERM GOAL #3   Title pt will increase her FOTO score by > 10 points to help with increased funcitonal capabilities (10/02/2014)   Time 3   Period Weeks   Status On-going           PT Long Term Goals - 09/25/14 1824    PT LONG TERM GOAL #4   Title At discharge pt will be I will all HEP given throughout therapy  (10/23/2014)   Time 6   Period Weeks   Status On-going   PT LONG TERM GOAL #5   Title pt will increase L knee AROM to >120 degrees to assist with functional  and efficent gait pattern (10/23/2014)   Time 6   Period Weeks   Status On-going   PT LONG TERM GOAL #6   Title She will increase strength in the L knee to > 4+/5 to assist with prolong standing and walking activities  to assist with possible new job activities and ADLs (10/23/2014)   Time 6   Period Weeks   Status On-going   PT LONG TERM GOAL #7   Title She will be able to walk/stand > 30 min and navigate > 10 steps with < 2/10 pain to assist with community ambulation and endurance (10/23/2014)   Time 6   Period Weeks   Status On-going   PT LONG TERM GOAL #8   Title pt will increase her FOTO score to atleast 49 to demonstrate improved functional capabilities at discharge (10/23/2014)   Time 6   Period Weeks   Status On-going               Plan - 10/01/14 1503    Clinical Impression Statement Pain improving. AROM improved. Progressed pt to light resistance without c/o increased pain.    PT Next Visit Plan  knee mobility, strengthening, modalities PRN, Soft tissue work, Materials engineer, [place]        Problem List Patient Active Problem List   Diagnosis Date Noted  . Acute meniscal tear of left knee  08/28/2014  . Dermatitis due to drug reaction 07/16/2014  . Dental infection 07/10/2014  . Left knee pain 06/08/2014  . Restless leg syndrome 06/08/2014  . Caregiver stress 12/25/2013  . SLEEP APNEA 12/31/2009  . LEG EDEMA, BILATERAL 12/31/2009  . FREQUENCY, URINARY 08/25/2009  . HYPERTENSION, BENIGN ESSENTIAL 08/09/2006  . SYMPTOM, DIZZINESS AND GIDDINESS 08/09/2006  . OBESITY, NOS 03/17/2006  . GASTROESOPHAGEAL REFLUX, NO ESOPHAGITIS 03/17/2006  . MENOPAUSAL SYNDROME 03/17/2006    Dorene Ar, PTA 10/01/2014, 3:05 PM  [facility] [address] [place], [place], [address] Phone: [phone]   Fax:  [phone]

## 2014-10-04 ENCOUNTER — Ambulatory Visit: Payer: No Typology Code available for payment source | Admitting: Physical Therapy

## 2014-10-08 ENCOUNTER — Ambulatory Visit: Payer: No Typology Code available for payment source | Admitting: Physical Therapy

## 2014-10-11 ENCOUNTER — Ambulatory Visit: Payer: No Typology Code available for payment source | Admitting: Physical Therapy

## 2014-10-15 ENCOUNTER — Ambulatory Visit: Payer: No Typology Code available for payment source | Admitting: Physical Therapy

## 2014-10-15 ENCOUNTER — Telehealth: Payer: Self-pay | Admitting: Physical Therapy

## 2014-10-15 NOTE — Telephone Encounter (Signed)
Discussed with pt that she missed the last few appointments and has another appointment at 11am on Friday to which she stated she thought she cancelled all the 11am ones due to conflicts.  Educated  pt that she has another appointment on 10/22/2014 at 2:15 and she stated she will make it.

## 2014-10-18 ENCOUNTER — Encounter: Payer: No Typology Code available for payment source | Admitting: Physical Therapy

## 2014-10-22 ENCOUNTER — Ambulatory Visit: Payer: No Typology Code available for payment source | Attending: Family Medicine | Admitting: Physical Therapy

## 2014-10-22 DIAGNOSIS — M25562 Pain in left knee: Secondary | ICD-10-CM | POA: Insufficient documentation

## 2014-10-22 DIAGNOSIS — R29898 Other symptoms and signs involving the musculoskeletal system: Secondary | ICD-10-CM | POA: Insufficient documentation

## 2014-10-22 DIAGNOSIS — R269 Unspecified abnormalities of gait and mobility: Secondary | ICD-10-CM | POA: Insufficient documentation

## 2014-10-22 DIAGNOSIS — M25662 Stiffness of left knee, not elsewhere classified: Secondary | ICD-10-CM

## 2014-10-22 NOTE — Therapy (Signed)
[place], [place], [address] Phone: [phone]   Fax:  [phone]  Physical Therapy Treatment  Patient Details  Name: Regina Burns MRN: [id number] Date of Birth: Feb 10, 1955 Referring Provider:  Lorna Few, DO  Encounter Date: 10/22/2014      PT End of Session - 10/22/14 1422    Visit Number 4   Number of Visits 12   Date for PT Re-Evaluation 10/23/14   PT Start Time 1324   PT Stop Time 1508   PT Time Calculation (min) 53 min   Activity Tolerance Patient tolerated treatment well   Behavior During Therapy Regina Burns for tasks assessed/performed      Past Medical History  Diagnosis Date  . GERD (gastroesophageal reflux disease) 2005  . Hypertension 2012  . Hyperlipidemia 2012    Past Surgical History  Procedure Laterality Date  . Appendectomy  1965  . Hernia repair  1965    There were no vitals filed for this visit.  Visit Diagnosis:  Left knee pain  Left leg weakness  Abnormality of gait  Decreased ROM of left knee      Subjective Assessment - 10/22/14 1414    Currently in Pain? Yes   Pain Score 7    Pain Location Knee   Pain Orientation Left   Pain Descriptors / Indicators Aching;Sharp   Pain Type Chronic pain   Pain Onset More than a month ago   Pain Frequency Constant   Aggravating Factors  sit to stand transfers, bending the knee,    Pain Relieving Factors ibuprofen, quad sets            [facility] PT Assessment - 10/22/14 1427    Assessment   Medical Diagnosis Left meniscus tear   Hand Dominance Right   Next MD Visit make one as PRN   Prior Therapy yes   Precautions   Precautions None   Restrictions   Weight Bearing Restrictions No   Balance Screen   Has the patient fallen in the past 6 months Yes   How many times? 1   Has the patient had a decrease in activity level because of a fear of falling?  No   Is the patient reluctant to leave their home  because of a fear of falling?  No   Home Environment   Living Environment Private residence   Living Arrangements Children;Parent   Available Help at Discharge Available PRN/intermittently;Available 24 hours/day   Type of Home House   Home Access Level entry   Home Layout One level   Prior Function   Level of Independence Independent with basic ADLs   Vocation Unemployed   Leisure walking, reading, writing poetry,    Cognition   Overall Cognitive Status Within Functional Limits for tasks assessed   Observation/Other Assessments   Focus on Therapeutic Outcomes (FOTO)  70% limited   Posture/Postural Control   Posture/Postural Control Postural limitations   Postural Limitations Rounded Shoulders;Forward head   AROM   Left Knee Extension 0   Left Knee Flexion 112   PROM   Left Knee Flexion 116   Strength   Right Knee Flexion 5/5   Right Knee Extension 5/5   Left Knee Flexion 4/5   Left Knee Extension 4/5                     [facility] Adult PT Treatment/Exercise - 10/22/14 1427    Knee/Hip Exercises: Aerobic   Nustep  L6 x 10 min   Knee/Hip Exercises: Supine   Short Arc Quad Sets Left;15 reps   Straight Leg Raises AROM;Both;10 reps   Other Supine Knee/Hip Exercises Clam shells 1 x 10 with green theraband   Moist Heat Therapy   Number Minutes Moist Heat 10 Minutes   Moist Heat Location Knee  in supine   Electrical Stimulation   Electrical Stimulation Location Left knee   Electrical Stimulation Goals Pain   Manual Therapy   Joint Mobilization grade 2-3 P<>A tibiofemoral mobs                PT Education - 10/22/14 1455    Education provided Yes   Education Details updated and reviewed HEP    Person(s) Educated Patient   Methods Explanation   Comprehension Verbalized understanding          PT Short Term Goals - 10/22/14 1459    PT SHORT TERM GOAL #1   Title pt will be I with initial HEP (10/02/2014)   Time 3   Period Weeks   Status Achieved    PT SHORT TERM GOAL #2   Title pt will be able to tolerated standing and walking for >10 minutes with < 4/10 pain to assist with functional progression (10/02/2014)   Time 3   Period Weeks   Status On-going   PT SHORT TERM GOAL #3   Title pt will increase her FOTO score by > 10 points to help with increased funcitonal capabilities (10/02/2014)   Time 3   Period Weeks   Status Partially Met           PT Long Term Goals - 10/22/14 1459    PT LONG TERM GOAL #4   Title At discharge pt will be I will all HEP given throughout therapy  (10/23/2014)   Time 6   Period Weeks   Status On-going   PT LONG TERM GOAL #5   Title pt will increase L knee AROM to >120 degrees to assist with functional and efficent gait pattern (10/23/2014)   Time 6   Period Weeks   Status On-going   PT LONG TERM GOAL #6   Title She will increase strength in the L knee to > 4+/5 to assist with prolong standing and walking activities  to assist with possible new job activities and ADLs (10/23/2014)   Time 6   Period Weeks   Status On-going   PT LONG TERM GOAL #7   Title She will be able to walk/stand > 30 min and navigate > 10 steps with < 2/10 pain to assist with community ambulation and endurance (10/23/2014)   Time 6   Period Weeks   Status On-going   PT LONG TERM GOAL #8   Title pt will increase her FOTO score to atleast 49 to demonstrate improved functional capabilities at discharge (10/23/2014)   Time 6   Period Weeks   Status On-going               Plan - 10/22/14 1456    Clinical Impression Statement [name] has demonstrated some improvement with mobilty and strength and has improved her FOTO score by 5 points. she partially met STG #3 today,  She continues to exhibit limited AROM compared bil and some weakness. She was able to complete all exercises today without increased complaint of pain. plan to continue to with current POC.    Pt will benefit from skilled therapeutic intervention in order to  improve on  the following deficits Decreased activity tolerance;Decreased balance;Decreased endurance;Decreased range of motion;Increased edema;Decreased strength;Decreased mobility;Pain;Obesity;Hypomobility;Decreased safety awareness   Rehab Potential Good   PT Frequency 2x / week   PT Duration 4 weeks   PT Treatment/Interventions ADLs/Self Care Home Management;Cryotherapy;Electrical Stimulation;Iontophoresis 38m/ml Dexamethasone;Moist Heat;Ultrasound;Therapeutic activities;Therapeutic exercise;Patient/family education;Manual techniques;Taping;Dry needling;Passive range of motion;Gait training   PT Next Visit Plan  knee mobility, strengthening, modalities PRN, Soft tissue work, IMaterials engineer [place]  PT Home Exercise Plan standing hip extension/ abduction   Consulted and Agree with Plan of Care Patient        Problem List Patient Active Problem List   Diagnosis Date Noted  . Acute meniscal tear of left knee 08/28/2014  . Dermatitis due to drug reaction 07/16/2014  . Dental infection 07/10/2014  . Left knee pain 06/08/2014  . Restless leg syndrome 06/08/2014  . Caregiver stress 12/25/2013  . SLEEP APNEA 12/31/2009  . LEG EDEMA, BILATERAL 12/31/2009  . FREQUENCY, URINARY 08/25/2009  . HYPERTENSION, BENIGN ESSENTIAL 08/09/2006  . SYMPTOM, DIZZINESS AND GIDDINESS 08/09/2006  . OBESITY, NOS 03/17/2006  . GASTROESOPHAGEAL REFLUX, NO ESOPHAGITIS 03/17/2006  . MENOPAUSAL SYNDROME 03/17/2006   KStarr LakePT, DPT, LAT, ATC  10/22/2014  5:17 PM      [facility]GToughkenamon N[place] [address]Phone: [phone]  Fax:  [phone]

## 2014-10-22 NOTE — Patient Instructions (Signed)
   Begin walking slowly progressing the time/distance.  Starr Lake PT, DPT, LAT, ATC  [facility] Phone: [phone]

## 2014-10-23 ENCOUNTER — Ambulatory Visit: Payer: No Typology Code available for payment source

## 2014-10-23 ENCOUNTER — Telehealth: Payer: Self-pay | Admitting: Family Medicine

## 2014-10-23 NOTE — Telephone Encounter (Signed)
Patient dropped off papers to be filled out.  Please call her when completed.

## 2014-10-24 ENCOUNTER — Ambulatory Visit (INDEPENDENT_AMBULATORY_CARE_PROVIDER_SITE_OTHER): Payer: Self-pay | Admitting: *Deleted

## 2014-10-24 DIAGNOSIS — Z23 Encounter for immunization: Secondary | ICD-10-CM

## 2014-10-24 NOTE — Telephone Encounter (Signed)
Form placed in PCP box. Zimmerman Rumple, April D, CMA  

## 2014-10-25 ENCOUNTER — Ambulatory Visit: Payer: No Typology Code available for payment source | Admitting: Physical Therapy

## 2014-10-25 NOTE — Telephone Encounter (Signed)
Patient informed that form is complete and ready for pick up.  Martin, Tamika L, RN  

## 2014-10-25 NOTE — Telephone Encounter (Signed)
Formed filled out and placed in [employer]

## 2014-11-06 ENCOUNTER — Ambulatory Visit: Payer: No Typology Code available for payment source | Admitting: Physical Therapy

## 2014-11-08 ENCOUNTER — Other Ambulatory Visit: Payer: Self-pay | Admitting: *Deleted

## 2014-11-08 MED ORDER — AMLODIPINE BESYLATE 10 MG PO TABS: 10.0000 mg | ORAL_TABLET | Freq: Every day | ORAL | Status: DC

## 2014-11-12 ENCOUNTER — Ambulatory Visit: Payer: No Typology Code available for payment source | Admitting: Physical Therapy

## 2014-11-14 ENCOUNTER — Ambulatory Visit: Payer: No Typology Code available for payment source | Admitting: Physical Therapy

## 2014-11-19 ENCOUNTER — Ambulatory Visit: Payer: No Typology Code available for payment source | Attending: Family Medicine | Admitting: Physical Therapy

## 2014-11-19 DIAGNOSIS — R269 Unspecified abnormalities of gait and mobility: Secondary | ICD-10-CM

## 2014-11-19 DIAGNOSIS — R29898 Other symptoms and signs involving the musculoskeletal system: Secondary | ICD-10-CM

## 2014-11-19 DIAGNOSIS — M25562 Pain in left knee: Secondary | ICD-10-CM

## 2014-11-19 DIAGNOSIS — M25662 Stiffness of left knee, not elsewhere classified: Secondary | ICD-10-CM

## 2014-11-19 NOTE — Therapy (Signed)
[place], [place], [address] Phone: [phone]   Fax:  [phone]  Physical Therapy Treatment  Patient Details  Name: Regina Burns MRN: [id number] Date of Birth: Dec 06, 1955 No Data Recorded  Encounter Date: 11/19/2014      PT End of Session - 11/19/14 1431    Visit Number 5   Number of Visits 14   Date for PT Re-Evaluation 12/17/14   PT Start Time 9983   PT Stop Time 1505   PT Time Calculation (min) 50 min   Activity Tolerance Patient tolerated treatment well   Behavior During Therapy [facility] for tasks assessed/performed      Past Medical History  Diagnosis Date  . GERD (gastroesophageal reflux disease) 2005  . Hypertension 2012  . Hyperlipidemia 2012    Past Surgical History  Procedure Laterality Date  . Appendectomy  1965  . Hernia repair  1965    There were no vitals filed for this visit.  Visit Diagnosis:  Left knee pain - Plan: PT plan of care cert/re-cert  Left leg weakness - Plan: PT plan of care cert/re-cert  Abnormality of gait - Plan: PT plan of care cert/re-cert  Decreased ROM of left knee - Plan: PT plan of care cert/re-cert      Subjective Assessment - 11/19/14 1419    Subjective "The knee has been feeling off and on, somtimes it is worse than others"    Currently in Pain? No/denies   Pain Score 8   with standing   Pain Location Knee   Pain Orientation Left   Pain Descriptors / Indicators Aching;Sharp   Pain Onset More than a month ago   Pain Frequency Constant   Aggravating Factors  prolonged standing   Pain Relieving Factors ibuprofen            [facility] PT Assessment - 11/19/14 1422    Assessment   Medical Diagnosis Left meniscus tear   Hand Dominance Right   Next MD Visit make one as PRN   Prior Therapy yes   Precautions   Precautions None   Restrictions   Weight Bearing Restrictions No   Balance Screen   Has the patient fallen in the past  6 months Yes   How many times? 1   Has the patient had a decrease in activity level because of a fear of falling?  No   Is the patient reluctant to leave their home because of a fear of falling?  No   Home Environment   Living Environment Private residence   Living Arrangements Children;Parent   Available Help at Discharge Available PRN/intermittently;Available 24 hours/day   Type of Home House   Home Access Level entry   Home Layout One level   Observation/Other Assessments   Focus on Therapeutic Outcomes (FOTO)  66% limited   AROM   Left Knee Extension 0   Left Knee Flexion 101  sharp pain radiating across the front of the knee with flex   Strength   Strength Assessment Site Hip   Right/Left Hip Right;Left   Right Hip Flexion 3+/5   Right Hip Extension 4-/5   Right Hip ABduction 4-/5   Right Hip ADduction 4/5   Left Hip Flexion 3+/5   Left Hip Extension 4-/5   Left Hip ABduction 4-/5   Left Hip ADduction 4/5   Right Knee Flexion 5/5   Right Knee Extension 5/5   Left Knee Flexion 4/5   Left Knee Extension  4/5                     [facility] Adult PT Treatment/Exercise - 11/19/14 0001    Knee/Hip Exercises: Aerobic   Nustep L3 x 8 min   Knee/Hip Exercises: Seated   Long Arc Quad AROM;Strengthening;Left;2 sets;15 reps;Weights   Long Arc Quad Weight 3 lbs.   Sit to [employer] 1 set;10 reps   Knee/Hip Exercises: Supine   Short Arc [employer] Left;15 reps   Short Arc Quad Sets Limitations 3#   Bridges AROM;Strengthening;Both;1 set;10 reps   Straight Leg Raises AROM;Both;10 reps   Straight Leg Raises Limitations 3#   Other Supine Knee/Hip Exercises Clam shells 2 x 15 with green theraband   Knee/Hip Exercises: Sidelying   Hip ABduction AROM;Strengthening;Left;1 set;10 reps  3#   Hip ABduction Limitations decreased strenght and increased fatigue   Moist Heat Therapy   Number Minutes Moist Heat 10 Minutes   Moist Heat Location Knee  in supine   Electrical Stimulation    Electrical Stimulation Location Left knee   Electrical Stimulation Action IFC   Electrical Stimulation Parameters 10 min   Electrical Stimulation Goals Pain   Manual Therapy   Joint Mobilization grade 2-3 P<>A tibiofemoral mobs                PT Education - 11/19/14 1429    Education provided Yes   Education Details updated POC   Person(s) Educated Patient   Methods Explanation   Comprehension Verbalized understanding          PT Short Term Goals - 11/19/14 1451    PT SHORT TERM GOAL #2   Title pt will be able to tolerated standing and walking for >10 minutes with < 4/10 pain to assist with functional progression (10/02/2014)   Time 3   Period Weeks   Status On-going   PT SHORT TERM GOAL #3   Title pt will increase her FOTO score by > 10 points to help with increased funcitonal capabilities (10/02/2014)   Time 3   Period Weeks   Status Partially Met           PT Long Term Goals - 11/19/14 1451    PT LONG TERM GOAL #4   Title At discharge pt will be I will all HEP given throughout therapy  (10/23/2014)   Time 6   Period Weeks   Status On-going   PT LONG TERM GOAL #5   Title pt will increase L knee AROM to >120 degrees to assist with functional and efficent gait pattern (10/23/2014)   Time 6   Period Weeks   Status On-going   PT LONG TERM GOAL #6   Title She will increase strength in the L knee to > 4+/5 to assist with prolong standing and walking activities  to assist with possible new job activities and ADLs (10/23/2014)   Time 6   Period Weeks   Status On-going   PT LONG TERM GOAL #7   Title She will be able to walk/stand > 30 min and navigate > 10 steps with < 2/10 pain to assist with community ambulation and endurance (10/23/2014)   Time 6   Period Weeks   Status On-going   PT LONG TERM GOAL #8   Title pt will increase her FOTO score to atleast 49 to demonstrate improved functional capabilities at discharge (10/23/2014)   Time 6   Period Weeks    Status On-going  Plan - 11/19/14 1449    Clinical Impression Statement Carolyin presents to therapy after being absent for about 1 month due to personal family issues. She continues to report pain in the L knee and exhibits weakness not only in the knee but the hip as well. She reported significant fatigue following hip and knee exercises. She has not met any new goals today. She would benefit from continued physical therapy to progress toward her goals.    Pt will benefit from skilled therapeutic intervention in order to improve on the following deficits Decreased activity tolerance;Decreased balance;Decreased endurance;Decreased range of motion;Increased edema;Decreased strength;Decreased mobility;Pain;Obesity;Hypomobility;Decreased safety awareness   Rehab Potential Good   PT Frequency 2x / week   PT Duration 4 weeks   PT Treatment/Interventions ADLs/Self Care Home Management;Cryotherapy;Electrical Stimulation;Iontophoresis 20m/ml Dexamethasone;Moist Heat;Ultrasound;Therapeutic activities;Therapeutic exercise;Patient/family education;Manual techniques;Taping;Dry needling;Passive range of motion;Gait training   PT Next Visit Plan  knee mobility, strengthening, modalities PRN, Soft tissue work, IMaterials engineer [place]  PT Home Exercise Plan HEP review, increase sets/reps to increase strength   Consulted and Agree with Plan of Care Patient        Problem List Patient Active Problem List   Diagnosis Date Noted  . Acute meniscal tear of left knee 08/28/2014  . Dermatitis due to drug reaction 07/16/2014  . Dental infection 07/10/2014  . Left knee pain 06/08/2014  . Restless leg syndrome 06/08/2014  . Caregiver stress 12/25/2013  . SLEEP APNEA 12/31/2009  . LEG EDEMA, BILATERAL 12/31/2009  . FREQUENCY, URINARY 08/25/2009  . HYPERTENSION, BENIGN ESSENTIAL 08/09/2006  . SYMPTOM, DIZZINESS AND GIDDINESS 08/09/2006  . OBESITY, NOS 03/17/2006  . GASTROESOPHAGEAL REFLUX, NO ESOPHAGITIS  03/17/2006  . MENOPAUSAL SYNDROME 03/17/2006   KStarr LakePT, DPT, LAT, ATC  11/19/2014  3:05 PM    [facility] [address]Phone: [phone]  Fax:  [phone] Name: CELLIETTE SEABOLTMRN: [id number] of Birth: 602-Jun-1957

## 2014-11-21 ENCOUNTER — Ambulatory Visit: Payer: No Typology Code available for payment source | Admitting: Physical Therapy

## 2014-11-21 DIAGNOSIS — R269 Unspecified abnormalities of gait and mobility: Secondary | ICD-10-CM

## 2014-11-21 DIAGNOSIS — M25562 Pain in left knee: Secondary | ICD-10-CM

## 2014-11-21 DIAGNOSIS — R29898 Other symptoms and signs involving the musculoskeletal system: Secondary | ICD-10-CM

## 2014-11-21 DIAGNOSIS — M25662 Stiffness of left knee, not elsewhere classified: Secondary | ICD-10-CM

## 2014-11-21 NOTE — Therapy (Addendum)
[place], [place], [address] Phone: [phone]   Fax:  [phone]  Physical Therapy Treatment  Patient Details  Name: Regina Burns MRN: [id number] Date of Birth: 1955/11/02 No Data Recorded  Encounter Date: 11/21/2014      PT End of Session - 11/21/14 1409    Visit Number 6   Number of Visits 14   Date for PT Re-Evaluation 12/17/14   PT Start Time 1406   PT Stop Time 1506   PT Time Calculation (min) 60 min      Past Medical History  Diagnosis Date  . GERD (gastroesophageal reflux disease) 2005  . Hypertension 2012  . Hyperlipidemia 2012    Past Surgical History  Procedure Laterality Date  . Appendectomy  1965  . Hernia repair  1965    There were no vitals filed for this visit.  Visit Diagnosis:  Left knee pain  Left leg weakness  Abnormality of gait  Decreased ROM of left knee      Subjective Assessment - 11/21/14 1410    Subjective "good days and bad days" Today pain is in the left patellor tendon "It hurts right here in the front"   Pain Score 5    Pain Location Knee                         [facility] Adult PT Treatment/Exercise - 11/21/14 1521    Knee/Hip Exercises: Aerobic   Nustep L3 x 8 min   Knee/Hip Exercises: Seated   Long Arc Quad AROM;Strengthening;Left;2 sets;15 reps;Weights   Long Arc Quad Weight 3 lbs.   Clamshell with [employer] --  2 sets of 10   Knee/Hip Exercises: Supine   Short Arc Quad Sets Left;15 reps   Straight Leg Raises AROM;Left side;8 reps   Straight Leg Raises Limitations 3#   Other Supine Knee/Hip Exercises Clam shells 2 x 15 with green theraband Supine marches x20 reps   Moist Heat Therapy   Number Minutes Moist Heat 15 Minutes   Moist Heat Location Knee   Electrical Stimulation   Electrical Stimulation Location Left knee   Electrical Stimulation Action IFC   Electrical Stimulation Parameters 10 min   Electrical  Stimulation Goals Pain   Ultrasound   Ultrasound Location LT knee over patellor tendon   Ultrasound Parameters pulsed 1.0 Wcm2 8 minutes   Ultrasound Goals Pain   Iontophoresis   Type of Iontophoresis Dexamethasone   Location LT patellor tendon   Dose 30m   Time 6 hours                  PT Short Term Goals - 11/19/14 1451    PT SHORT TERM GOAL #2   Title pt will be able to tolerated standing and walking for >10 minutes with < 4/10 pain to assist with functional progression (10/02/2014)   Time 3   Period Weeks   Status On-going   PT SHORT TERM GOAL #3   Title pt will increase her FOTO score by > 10 points to help with increased funcitonal capabilities (10/02/2014)   Time 3   Period Weeks   Status Partially Met           PT Long Term Goals - 11/19/14 1451    PT LONG TERM GOAL #4   Title At discharge pt will be I will all HEP given throughout therapy  (10/23/2014)   Time 6   Period  Weeks   Status On-going   PT LONG TERM GOAL #5   Title pt will increase L knee AROM to >120 degrees to assist with functional and efficent gait pattern (10/23/2014)   Time 6   Period Weeks   Status On-going   PT LONG TERM GOAL #6   Title She will increase strength in the L knee to > 4+/5 to assist with prolong standing and walking activities  to assist with possible new job activities and ADLs (10/23/2014)   Time 6   Period Weeks   Status On-going   PT LONG TERM GOAL #7   Title She will be able to walk/stand > 30 min and navigate > 10 steps with < 2/10 pain to assist with community ambulation and endurance (10/23/2014)   Time 6   Period Weeks   Status On-going   PT LONG TERM GOAL #8   Title pt will increase her FOTO score to atleast 49 to demonstrate improved functional capabilities at discharge (10/23/2014)   Time 6   Period Weeks   Status On-going               Plan - 11/21/14 1414    Clinical Impression Statement patient presents with pain in her left knee especially  around the patellor tendon. Focus of this treatment is on strengthening but also reducing the pain in her left knee with [place] and Iontophoresis to increase function and ability to perform exercises   PT Next Visit Plan  knee mobility, strengthening, modalities PRN, Soft tissue work, [place], [place]     During this treatment session, the therapist was present, participating in and directing the treatment.   Problem List Patient Active Problem List   Diagnosis Date Noted  . Acute meniscal tear of left knee 08/28/2014  . Dermatitis due to drug reaction 07/16/2014  . Dental infection 07/10/2014  . Left knee pain 06/08/2014  . Restless leg syndrome 06/08/2014  . Caregiver stress 12/25/2013  . SLEEP APNEA 12/31/2009  . LEG EDEMA, BILATERAL 12/31/2009  . FREQUENCY, URINARY 08/25/2009  . HYPERTENSION, BENIGN ESSENTIAL 08/09/2006  . SYMPTOM, DIZZINESS AND GIDDINESS 08/09/2006  . OBESITY, NOS 03/17/2006  . GASTROESOPHAGEAL REFLUX, NO ESOPHAGITIS 03/17/2006  . MENOPAUSAL SYNDROME 03/17/2006   Laury Axon, [place] 11/21/2014 5:09 PM PHONE:[phone] [phone]  Hessie Diener, PTA 11/21/2014 5:09 PM Phone: [phone] Fax: [place], [place], [address] Phone: [phone]   Fax:  [phone]  Name: RUBEN PYKA MRN: [id number] Date of Birth: Feb 17, 1955      PHYSICAL THERAPY DISCHARGE SUMMARY  Visits from Start of Care: 6  Current functional level related to goals / functional outcomes: See goals   Remaining deficits: Unknown due to pt not returning since the last attended visit.   Education / Equipment: HEP, theraband for strengthening.   Plan: Patient agrees to discharge.  Patient goals were not met. Patient is being discharged due to not returning since the last visit.  ?????        Kristoffer Leamon PT, DPT, LAT, ATC  01/08/2015  10:11 AM

## 2014-11-26 ENCOUNTER — Ambulatory Visit: Payer: No Typology Code available for payment source | Admitting: Physical Therapy

## 2014-11-28 ENCOUNTER — Ambulatory Visit: Payer: No Typology Code available for payment source | Admitting: Physical Therapy

## 2014-12-03 ENCOUNTER — Ambulatory Visit: Payer: No Typology Code available for payment source | Admitting: Physical Therapy

## 2014-12-05 ENCOUNTER — Ambulatory Visit: Payer: No Typology Code available for payment source | Admitting: Physical Therapy

## 2014-12-10 ENCOUNTER — Encounter: Payer: No Typology Code available for payment source | Admitting: Physical Therapy

## 2015-02-03 NOTE — Telephone Encounter (Signed)
Opened in error

## 2015-02-10 ENCOUNTER — Other Ambulatory Visit: Payer: Self-pay | Admitting: Family Medicine

## 2015-03-25 ENCOUNTER — Ambulatory Visit: Payer: Self-pay

## 2015-07-16 ENCOUNTER — Other Ambulatory Visit: Payer: Self-pay | Admitting: Family Medicine

## 2015-09-01 ENCOUNTER — Other Ambulatory Visit: Payer: Self-pay | Admitting: *Deleted

## 2015-09-02 MED ORDER — GABAPENTIN 100 MG PO CAPS: 100.0000 mg | ORAL_CAPSULE | Freq: Every day | ORAL | 3 refills | Status: DC

## 2015-11-28 ENCOUNTER — Other Ambulatory Visit: Payer: Self-pay | Admitting: Family Medicine

## 2015-12-29 ENCOUNTER — Ambulatory Visit (INDEPENDENT_AMBULATORY_CARE_PROVIDER_SITE_OTHER): Payer: BC Managed Care – PPO | Admitting: Family Medicine

## 2015-12-29 VITALS — BP 132/64 | HR 90 | Temp 97.9°F | Ht 68.0 in | Wt 332.2 lb

## 2015-12-29 DIAGNOSIS — G4730 Sleep apnea, unspecified: Secondary | ICD-10-CM

## 2015-12-29 DIAGNOSIS — Z23 Encounter for immunization: Secondary | ICD-10-CM

## 2015-12-29 DIAGNOSIS — Z1159 Encounter for screening for other viral diseases: Secondary | ICD-10-CM

## 2015-12-29 DIAGNOSIS — R609 Edema, unspecified: Secondary | ICD-10-CM

## 2015-12-29 DIAGNOSIS — R600 Localized edema: Secondary | ICD-10-CM | POA: Diagnosis not present

## 2015-12-29 DIAGNOSIS — I10 Essential (primary) hypertension: Secondary | ICD-10-CM

## 2015-12-29 DIAGNOSIS — Z0000 Encounter for general adult medical examination without abnormal findings: Secondary | ICD-10-CM

## 2015-12-29 MED ORDER — GABAPENTIN 100 MG PO CAPS: 200.0000 mg | ORAL_CAPSULE | Freq: Every day | ORAL | 3 refills | Status: DC

## 2015-12-29 NOTE — Patient Instructions (Signed)
Thank you so much for coming to visit today! You seem to be doing well today! We will check several labs today, including CMP, Lipid Panel, CBC, Hepatitis C (checked in all patients in your age range), and BNP. I have placed referrals for a Sleep Study and Cardiology. I will contact you with the results.  Dr. Gerlean Ren

## 2015-12-29 NOTE — Progress Notes (Signed)
Subjective:     Patient ID: Regina Burns, female   DOB: 12-01-1955, 60 y.o.   MRN: [id number]  HPI [name] is a 60yo female presenting today for annual exam for follow upof hypertension. Also notes bilateral lower extremity edema. Reports right knee pain from meniscal tear is much improved from last office visit.  # Hypertension: Currently prescribed Amlodipine and Atenolol-Chlorthalidone and well controlled on regimen. Denies headache, weakness, numbness, chest pain. Notes some shortness of breath with exertion detailed below.  # Lower Extremity Edema: Notes worsening edema in bilateral lower extremities. Swelling is improved by elevation and worsens throughout the day. Reports she has been told in the past she has sleep apnea, but denies any formal sleep studies. Reports history of snoring. Notes she often tired on awakening, but does not have the opportunity to take naps during the day. Denies headache on awakening. Reports she wakes up feeling like she can't breath. Denies orthopnea. Denies chest pain. Reports increased shortness of breath when climbing stairs. Denies changes in urine output. Denies bloody stools. Post-menopausal.   # Health Maintenance: - Refuses pap smear - No history of Hepatitis C testing - HIV negative in 08/2009 - Quit Smoking 10/28/2015  Review of Systems Per HPI    Objective:   Physical Exam  Constitutional: She is oriented to person, place, and time. She appears well-developed and well-nourished. No distress.  HENT:  Head: Normocephalic and atraumatic.  Mouth/Throat: No oropharyngeal exudate.  Cardiovascular: Normal rate and regular rhythm.   No murmur heard. Pulmonary/Chest: Effort normal. No respiratory distress. She has no wheezes.  Abdominal: Soft. She exhibits no distension. There is no tenderness.  Musculoskeletal: She exhibits no edema.  2+ pitting edema to knees bilaterally  Neurological: She is alert and oriented to person,  place, and time.  Skin: No rash noted.  Psychiatric: She has a normal mood and affect. Her behavior is normal.      Assessment and Plan:     HYPERTENSION, BENIGN ESSENTIAL Well Controlled. Continue Amlodipine and Atenolol-Chlorthalidone. If no etiology of lower extremity edema found, will discontinue Amlodipine--will continue for now given other suspected etiologies and and well controlled hypertension.  Sleep apnea Listed in [place], but not well-detailed in chart. Patient denies any formal workup or sleep studies. Will refer for sleep study. May be contributing to lower extremity edema.   LEG EDEMA, BILATERAL Will check CMP, CBC, BNP. Sleep Apnea workup initiated. Will refer to Cardiology given worsening shortness of breath on exertion and paternal history of heart disease--patient appears very anxious about cardiac disease and would like workup including stress test to be completed. Recommend compression stockings. Follow up pending results.   Healthcare maintenance - Refusing pap smear - Will obtain Hepatitis C screen - Check lipid panel

## 2015-12-29 NOTE — Assessment & Plan Note (Signed)
Will check CMP, CBC, BNP. Sleep Apnea workup initiated. Will refer to Cardiology given worsening shortness of breath on exertion and paternal history of heart disease--patient appears very anxious about cardiac disease and would like workup including stress test to be completed. Recommend compression stockings. Follow up pending results.

## 2015-12-29 NOTE — Assessment & Plan Note (Signed)
-   Refusing pap smear - Will obtain Hepatitis C screen - Check lipid panel

## 2015-12-29 NOTE — Assessment & Plan Note (Signed)
Listed in [place], but not well-detailed in chart. Patient denies any formal workup or sleep studies. Will refer for sleep study. May be contributing to lower extremity edema.

## 2015-12-29 NOTE — Assessment & Plan Note (Signed)
Well Controlled. Continue Amlodipine and Atenolol-Chlorthalidone. If no etiology of lower extremity edema found, will discontinue Amlodipine--will continue for now given other suspected etiologies and and well controlled hypertension.

## 2015-12-30 LAB — LIPID PANEL
CHOLESTEROL: 192 mg/dL (ref <200)
HDL: 59 mg/dL (ref >50)
LDL Cholesterol: 104 mg/dL — ABNORMAL HIGH (ref <100)
Total CHOL/HDL Ratio: 3.3 Ratio (ref <5.0)
Triglycerides: 143 mg/dL (ref <150)
VLDL: 29 mg/dL (ref <30)

## 2015-12-30 LAB — BRAIN NATRIURETIC PEPTIDE: BRAIN NATRIURETIC PEPTIDE: 18.8 pg/mL (ref <100)

## 2015-12-30 LAB — CBC
HEMATOCRIT: 38.5 % (ref 35.0–45.0)
Hemoglobin: 12.8 g/dL (ref 11.7–15.5)
MCH: 25.5 pg — ABNORMAL LOW (ref 27.0–33.0)
MCHC: 33.2 g/dL (ref 32.0–36.0)
MCV: 76.8 fL — AB (ref 80.0–100.0)
MPV: 9.9 fL (ref 7.5–12.5)
PLATELETS: 361 10*3/uL (ref 140–400)
RBC: 5.01 MIL/uL (ref 3.80–5.10)
RDW: 16.8 % — ABNORMAL HIGH (ref 11.0–15.0)
WBC: 7.2 10*3/uL (ref 3.8–10.8)

## 2015-12-30 LAB — COMPLETE METABOLIC PANEL WITH GFR
ALT: 12 U/L (ref 6–29)
AST: 15 U/L (ref 10–35)
Albumin: 4 g/dL (ref 3.6–5.1)
Alkaline Phosphatase: 85 U/L (ref 33–130)
BILIRUBIN TOTAL: 0.3 mg/dL (ref 0.2–1.2)
BUN: 16 mg/dL (ref 7–25)
CO2: 26 mmol/L (ref 20–31)
CREATININE: 1.1 mg/dL — AB (ref 0.50–0.99)
Calcium: 9.5 mg/dL (ref 8.6–10.4)
Chloride: 102 mmol/L (ref 98–110)
GFR, EST AFRICAN AMERICAN: 63 mL/min (ref >=60)
GFR, Est Non African American: 55 mL/min — ABNORMAL LOW (ref >=60)
GLUCOSE: 101 mg/dL — AB (ref 65–99)
Potassium: 3.6 mmol/L (ref 3.5–5.3)
Sodium: 140 mmol/L (ref 135–146)
TOTAL PROTEIN: 7.7 g/dL (ref 6.1–8.1)

## 2015-12-30 LAB — HEPATITIS C ANTIBODY: HCV Ab: NEGATIVE

## 2015-12-31 ENCOUNTER — Emergency Department (HOSPITAL_COMMUNITY): Payer: BC Managed Care – PPO

## 2015-12-31 ENCOUNTER — Emergency Department (HOSPITAL_COMMUNITY)
Admission: EM | Admit: 2015-12-31 | Discharge: 2015-12-31 | Disposition: A | Discharge status: Home / Self Care | Payer: BC Managed Care – PPO | Attending: Emergency Medicine | Admitting: Emergency Medicine

## 2015-12-31 ENCOUNTER — Encounter: Payer: Self-pay | Admitting: Physician Assistant

## 2015-12-31 DIAGNOSIS — M542 Cervicalgia: Secondary | ICD-10-CM | POA: Insufficient documentation

## 2015-12-31 DIAGNOSIS — M25519 Pain in unspecified shoulder: Secondary | ICD-10-CM | POA: Insufficient documentation

## 2015-12-31 DIAGNOSIS — M545 Low back pain: Secondary | ICD-10-CM | POA: Insufficient documentation

## 2015-12-31 DIAGNOSIS — Z87891 Personal history of nicotine dependence: Secondary | ICD-10-CM | POA: Insufficient documentation

## 2015-12-31 DIAGNOSIS — Y999 Unspecified external cause status: Secondary | ICD-10-CM | POA: Diagnosis not present

## 2015-12-31 DIAGNOSIS — Y92410 Unspecified street and highway as the place of occurrence of the external cause: Secondary | ICD-10-CM | POA: Insufficient documentation

## 2015-12-31 DIAGNOSIS — I10 Essential (primary) hypertension: Secondary | ICD-10-CM | POA: Insufficient documentation

## 2015-12-31 DIAGNOSIS — R930 Abnormal findings on diagnostic imaging of skull and head, not elsewhere classified: Secondary | ICD-10-CM | POA: Insufficient documentation

## 2015-12-31 DIAGNOSIS — Y939 Activity, unspecified: Secondary | ICD-10-CM | POA: Diagnosis not present

## 2015-12-31 DIAGNOSIS — Z79899 Other long term (current) drug therapy: Secondary | ICD-10-CM | POA: Diagnosis not present

## 2015-12-31 MED ORDER — TRAMADOL HCL 50 MG PO TABS: 25.0000 mg | ORAL_TABLET | Freq: Four times a day (QID) | ORAL | 0 refills | Status: DC | PRN
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
## 2015-12-31 NOTE — Discharge Instructions (Signed)

## 2015-12-31 NOTE — Progress Notes (Deleted)
Cardiology Office Note    Date:  12/31/2015   ID:  ALISHBA LITZENBERG, DOB 11-30-1955, MRN [id number]  PCP:  Junie Panning, DO  Cardiologist:  New  Chief Complaint: LE edema  History of Present Illness:   Regina Burns is a 60 y.o. female with hx of HTN, HLD and GERD who presented for LE edema evaluation.  Seen  By PCP 12/29/15 for yearly physician. Noted worsening bilateral lower extremity edema. No orthopnea. She is referred for sleep study for snoring. Plan to D/C amlodipine if no etiology of LE edema found. Recommended compression stocking. F/u BNP was normal,  LDL 104, HGb 12.8, Scr 1.1.  Here for further evaluation.    Past Medical History:  Diagnosis Date  . GERD (gastroesophageal reflux disease) 2005  . Hyperlipidemia 2012  . Hypertension 2012    Past Surgical History:  Procedure Laterality Date  . APPENDECTOMY  1965  . HERNIA REPAIR  1965    Current Medications: Prior to Admission medications   Medication Sig Start Date End Date Taking? Authorizing Provider  amLODipine (NORVASC) 10 MG tablet TAKE 1 TABLET(10 MG) BY MOUTH DAILY 02/11/15   [place] N Rumley, DO  atenolol-chlorthalidone (TENORETIC) 100-25 MG per tablet TAKE 1 TABLET BY MOUTH EVERY DAY 04/24/14   [place] N Rumley, DO  atenolol-chlorthalidone (TENORETIC) 100-25 MG tablet TAKE 1 TABLET BY MOUTH DAILY 07/17/15   [place] N Rumley, DO  gabapentin (NEURONTIN) 100 MG capsule Take 2-3 capsules (200-300 mg total) by mouth at bedtime. 12/29/15   [place] N Rumley, DO  lovastatin (MEVACOR) 20 MG tablet TAKE 2 TABLETS BY MOUTH AT BEDTIME 12/01/15   [place] N Rumley, DO  trolamine salicylate (ASPERCREME/ALOE) 10 % cream Apply 1 application topically as needed for muscle pain. 06/05/14   [place] N Rumley, DO    Allergies:   Ace inhibitors; Lisinopril; Clindamycin/lincomycin; and Contrast media [iodinated diagnostic agents]   Social History   Social History  . Marital status: Widowed    Spouse  name: N/A  . Number of children: 1  . Years of education: N/A   Occupational History  . UPPORT SPECIALIST [employer]   Social History Main Topics  . Smoking status: Former Smoker    Quit date: 01/18/1998  . Smokeless tobacco: Never Used  . Alcohol use No  . Drug use: No  . Sexual activity: Not Currently   Other Topics Concern  . Not on file   Social History Narrative   Works at [employer].      Family History:  The patient's family history includes Alcohol abuse in her mother; Alzheimer's disease in her maternal aunt and mother; Diabetes in her father and mother; Heart disease in her father and mother; Hyperlipidemia in her father; Hypertension in her father, maternal grandmother, mother, paternal aunt, and paternal uncle; Stroke in her mother. ***  ROS:   Please see the history of present illness.    ROS All other systems reviewed and are negative.   PHYSICAL EXAM:   VS:  There were no vitals taken for this visit.   GEN: Well nourished, well developed, in no acute distress  HEENT: normal  Neck: no JVD, carotid bruits, or masses Cardiac: ***RRR; no murmurs, rubs, or gallops,no edema  Respiratory:  clear to auscultation bilaterally, normal work of breathing GI: soft, nontender, nondistended, + BS MS: no deformity or atrophy  Skin: warm and dry, no rash Neuro:  Alert and Oriented x 3, Strength and sensation are intact Psych: euthymic  mood, full affect  Wt Readings from Last 3 Encounters:  12/29/15 (!) 332 lb 3.2 oz (150.7 kg)  08/27/14 (!) 314 lb 4.8 oz (142.6 kg)  07/19/14 (!) 310 lb 11.2 oz (140.9 kg)      Studies/Labs Reviewed:   EKG:  EKG is ordered today.  The ekg ordered today demonstrates ***  Recent Labs: 12/29/2015: ALT 12; Brain Natriuretic Peptide 18.8; BUN 16; Creat 1.10; Hemoglobin 12.8; Platelets 361; Potassium 3.6; Sodium 140   Lipid Panel    Component Value Date/Time   CHOL 192 12/29/2015 1614   TRIG 143 12/29/2015  1614   HDL 59 12/29/2015 1614   CHOLHDL 3.3 12/29/2015 1614   VLDL 29 12/29/2015 1614   LDLCALC 104 (H) 12/29/2015 1614    Additional studies/ records that were reviewed today include:   As above    ASSESSMENT & PLAN:    1. Bilateral lower extremity edema - BNP normal  2. HTN  3. HLD - 12/29/2015: Cholesterol 192; HDL 59; LDL Cholesterol 104; Triglycerides 143; VLDL 29   4. Snoring - pending sleep study    Medication Adjustments/Labs and Tests Ordered: Current medicines are reviewed at length with the patient today.  Concerns regarding medicines are outlined above.  Medication changes, Labs and Tests ordered today are listed in the Patient Instructions below. There are no Patient Instructions on file for this visit.   Jarrett Soho, [place]  12/31/2015 8:57 AM    [facility] [place], [place], [place]  [address] Phone: [phone]; Fax: [phone]
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
## 2016-01-08 ENCOUNTER — Ambulatory Visit: Payer: BC Managed Care – PPO | Admitting: Physician Assistant

## 2016-01-13 ENCOUNTER — Encounter: Payer: Self-pay | Admitting: Physician Assistant

## 2016-01-21 ENCOUNTER — Encounter (HOSPITAL_COMMUNITY): Payer: Self-pay | Admitting: Emergency Medicine

## 2016-01-21 ENCOUNTER — Emergency Department (HOSPITAL_COMMUNITY)
Admission: EM | Admit: 2016-01-21 | Discharge: 2016-01-21 | Disposition: A | Discharge status: Home / Self Care | Payer: BC Managed Care – PPO | Attending: Physician Assistant | Admitting: Physician Assistant

## 2016-01-21 ENCOUNTER — Ambulatory Visit: Payer: BC Managed Care – PPO | Admitting: Family Medicine

## 2016-01-21 ENCOUNTER — Emergency Department (HOSPITAL_COMMUNITY): Payer: BC Managed Care – PPO

## 2016-01-21 DIAGNOSIS — Z87891 Personal history of nicotine dependence: Secondary | ICD-10-CM | POA: Diagnosis not present

## 2016-01-21 DIAGNOSIS — Y999 Unspecified external cause status: Secondary | ICD-10-CM | POA: Insufficient documentation

## 2016-01-21 DIAGNOSIS — Y92410 Unspecified street and highway as the place of occurrence of the external cause: Secondary | ICD-10-CM | POA: Diagnosis not present

## 2016-01-21 DIAGNOSIS — S2221XA Fracture of manubrium, initial encounter for closed fracture: Secondary | ICD-10-CM | POA: Insufficient documentation

## 2016-01-21 DIAGNOSIS — Y939 Activity, unspecified: Secondary | ICD-10-CM | POA: Diagnosis not present

## 2016-01-21 DIAGNOSIS — I10 Essential (primary) hypertension: Secondary | ICD-10-CM | POA: Insufficient documentation

## 2016-01-21 DIAGNOSIS — S299XXA Unspecified injury of thorax, initial encounter: Secondary | ICD-10-CM | POA: Diagnosis present

## 2016-01-21 MED ORDER — HYDROCODONE-ACETAMINOPHEN 5-325 MG PO TABS
1.0000 | ORAL_TABLET | Freq: Once | ORAL | Status: AC
  Administered 2016-01-21: 1 via ORAL
  Filled 2016-01-21: qty 1

## 2016-01-21 MED ORDER — NAPROXEN 375 MG PO TABS: 375.0000 mg | ORAL_TABLET | Freq: Two times a day (BID) | ORAL | 0 refills | Status: DC

## 2016-01-21 NOTE — ED Notes (Signed)
Patient transported to CT scan . 

## 2016-01-21 NOTE — ED Notes (Signed)
EDP explained CT scan result and plan of care to pt.

## 2016-01-21 NOTE — ED Notes (Signed)
..  Video visit coupon no. [id number] and handout given to pt. on discharge .

## 2016-01-21 NOTE — Discharge Instructions (Signed)
The CT of your chest shows that there may be a tiny fracture to the bone in the mid chest area. Take the medication for pain and inflammation. Follow up with your doctor or return here as needed.
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
## 2016-01-29 ENCOUNTER — Ambulatory Visit (INDEPENDENT_AMBULATORY_CARE_PROVIDER_SITE_OTHER): Payer: BC Managed Care – PPO | Admitting: Family Medicine

## 2016-01-29 ENCOUNTER — Encounter: Payer: Self-pay | Admitting: Family Medicine

## 2016-01-29 VITALS — BP 110/42 | HR 72 | Temp 98.1°F | Ht 68.0 in | Wt 338.8 lb

## 2016-01-29 DIAGNOSIS — N63 Unspecified lump in unspecified breast: Secondary | ICD-10-CM
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
## 2016-02-06 ENCOUNTER — Other Ambulatory Visit: Payer: BC Managed Care – PPO

## 2016-02-18 ENCOUNTER — Ambulatory Visit: Payer: BC Managed Care – PPO | Admitting: Family Medicine

## 2016-02-21 ENCOUNTER — Other Ambulatory Visit: Payer: Self-pay | Admitting: Family Medicine

## 2016-02-24 ENCOUNTER — Ambulatory Visit: Payer: BC Managed Care – PPO | Admitting: Family Medicine

## 2016-02-26 ENCOUNTER — Institutional Professional Consult (permissible substitution): Payer: BC Managed Care – PPO | Admitting: Pulmonary Disease

## 2016-03-02 ENCOUNTER — Ambulatory Visit: Payer: BC Managed Care – PPO | Admitting: Family Medicine

## 2016-03-10 ENCOUNTER — Inpatient Hospital Stay: Admission: RE | Admit: 2016-03-10 | Payer: BC Managed Care – PPO | Source: Ambulatory Visit

## 2016-03-18 ENCOUNTER — Other Ambulatory Visit (HOSPITAL_COMMUNITY)
Admission: RE | Admit: 2016-03-18 | Discharge: 2016-03-18 | Disposition: A | Discharge status: Home / Self Care | Payer: BC Managed Care – PPO | Source: Ambulatory Visit | Attending: Family Medicine | Admitting: Family Medicine

## 2016-03-18 ENCOUNTER — Encounter: Payer: Self-pay | Admitting: Family Medicine

## 2016-03-18 ENCOUNTER — Ambulatory Visit (INDEPENDENT_AMBULATORY_CARE_PROVIDER_SITE_OTHER): Payer: BC Managed Care – PPO | Admitting: Family Medicine

## 2016-03-18 ENCOUNTER — Encounter (INDEPENDENT_AMBULATORY_CARE_PROVIDER_SITE_OTHER): Payer: BC Managed Care – PPO | Admitting: Licensed Clinical Social Worker

## 2016-03-18 VITALS — BP 128/58 | HR 65 | Temp 98.0°F | Ht 68.0 in | Wt 335.2 lb

## 2016-03-18 DIAGNOSIS — Z01419 Encounter for gynecological examination (general) (routine) without abnormal findings: Secondary | ICD-10-CM | POA: Insufficient documentation

## 2016-03-18 DIAGNOSIS — Z1151 Encounter for screening for human papillomavirus (HPV): Secondary | ICD-10-CM | POA: Insufficient documentation

## 2016-03-18 DIAGNOSIS — Z124 Encounter for screening for malignant neoplasm of cervix: Secondary | ICD-10-CM | POA: Diagnosis not present

## 2016-03-18 DIAGNOSIS — N950 Postmenopausal bleeding: Secondary | ICD-10-CM

## 2016-03-18 DIAGNOSIS — F411 Generalized anxiety disorder: Secondary | ICD-10-CM

## 2016-03-18 LAB — BASIC METABOLIC PANEL WITH GFR
BUN: 14 mg/dL (ref 7–25)
CHLORIDE: 100 mmol/L (ref 98–110)
CO2: 31 mmol/L (ref 20–31)
CREATININE: 1 mg/dL — AB (ref 0.50–0.99)
Calcium: 10.3 mg/dL (ref 8.6–10.4)
GFR, Est African American: 71 mL/min (ref >=60)
GFR, Est Non African American: 61 mL/min (ref >=60)
Glucose, Bld: 86 mg/dL (ref 65–99)
POTASSIUM: 3.4 mmol/L — AB (ref 3.5–5.3)
SODIUM: 141 mmol/L (ref 135–146)

## 2016-03-18 LAB — TSH: TSH: 1.04 mIU/L

## 2016-03-18 MED ORDER — SERTRALINE HCL 50 MG PO TABS: 50.0000 mg | ORAL_TABLET | Freq: Every day | ORAL | 0 refills | Status: DC

## 2016-03-18 MED ORDER — SERTRALINE HCL 25 MG PO TABS: 25.0000 mg | ORAL_TABLET | Freq: Every day | ORAL | 0 refills | Status: DC

## 2016-03-18 NOTE — Progress Notes (Signed)
Subjective:     Patient ID: Regina Burns, female   DOB: 04-30-1955, 61 y.o.   MRN: [id number]  HPI Regina Burns is a 61yo female presenting today for pap smear. Also of note, reported vaginal spotting and worsening anxiety. Due for pap smear with last documented in 2008. During pap smear, reports she has been scared to mention it previously, but she has had occasional vaginal bleeding. Reports LMP around age 42. Unsure how long she has been spotting, but suspects it is 1-2 times per year since that time. Does not history of fibroids. Reports spotting will last approximately 1-2 days and is minimal, not requiring pads. States she is sure it is coming from her vagina and not in her urine or stool. She is concerned she has cancer and anxious since she has waited almost 30 years to have it evaluated.  Notes increasing anxiety, centered around her health and her mother's health. Has not yet been for cardiac evaluation as referred in December, stating she missed her appointment but is planning on rescheduling and this has been contributing. Interested in starting medication for anxiety/depression and thinks she would like to initiate therapy to "talk to someone about my problems." Notes she worries a lot and has trouble relaxing. She is afraid something terrible may happen. Denies suicidal or homicidal ideation.  Former Smoker.  Review of Systems Per HPI.    Objective:   Physical Exam  Constitutional: She appears well-developed and well-nourished. No distress.  Cardiovascular: Normal rate and regular rhythm.   No murmur heard. Pulmonary/Chest: No respiratory distress. She has no wheezes.  Genitourinary:  Genitourinary Comments: No vaginal discharge noted. No vaginal bleeding noted. No cervical motion tenderness or adnexal tenderness. No vaginal wall tenderness.  Musculoskeletal: She exhibits no edema.  Psychiatric:  Anxious appearing.       Assessment and Plan:     1.  Screening for malignant neoplasm of cervix Pap smear today.  2. Generalized anxiety disorder GAD 7 elevated to 11, PHQ 9 score of 6. Denies suicidal or homicidal ideation. Zoloft initiated at 25mg  with plans to increase to 50mg  in one week. Establishing with [facility] today. Will check BMP and TSH today. Follow up in 4 weeks.  3. Postmenopausal bleeding LMP approximately 30 years ago. History of fibroids. Will obtain transvaginal and pelvic US. To return for endometrial biopsy after US obtained.

## 2016-03-18 NOTE — Patient Instructions (Addendum)
Thank you so much for coming to visit today! We did your pap smear today. We will check your kidney and thyroid function today. We will start Zoloft (Sertraline) today. Please take 25mg  for one week and then start the 50mg  tablets. Please establish with Behavioral health. I have ordered a Pelvic US. Please return for a Procedure visit (74min) to have your Endometrial biopsy after you have the [place]. Please reschedule an appointment with Cardiology.  Dr. Gerlean Ren

## 2016-03-18 NOTE — Progress Notes (Addendum)
  Regina Burns is a 61 y.o. female  Dr.Rumley requested [facility] for the following:  anxiety. Discussed services offered by [facility], verbal consent received.  Pt. reports the following symptoms/concerns anxiety, depression, fatigue, increased appetite, loss of interest in favorite activities and sleep disturbance.: difficult concentrating.    Duration of current symptoms/ problem: started about two years ago has gotten worse over the past year. Age of onset : Patient reports she has always been a "worrier" since she was a child Impact on function:not able to do the things she once enjoyed and is spending a lot of time in the bed. Previous out/inpatient treatment: none Famly hx psychiatric issues: Dad and sister has anxiety How often do you drink alcohol or beer: denies use What recreations drugs have you used in the past year? None Medical conditions that might explain or contribute to symptoms: none identified Assessments administered: GAD=11 indication of mod. Anxiety and PHQ-9= 6 mild depression Depression screen PHQ 2/9 03/18/2016  Decreased Interest 0  Down, Depressed, Hopeless 1  PHQ - 2 Score 1  Altered sleeping 1  Tired, decreased energy 1  Change in appetite 1  Feeling bad or failure about yourself  1  Trouble concentrating 1  Moving slowly or fidgety/restless 0  Suicidal thoughts 0  PHQ-9 Score 6  Difficult doing work/chores -   GAD 7 : Generalized Anxiety Score 03/18/2016  Nervous, Anxious, on Edge 2  Control/stop worrying 2  Worry too much - different things 2  Trouble relaxing 1  Restless 0  Easily annoyed or irritable 1  Afraid - awful might happen 3  Total GAD 7 Score 11  Anxiety Difficulty Somewhat difficult  LIFE CONTEXT:  Family & Social: lives with her son and 3 yr old grand son, has extended family that lives local School/ Work: 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 Life changes: was caring for mother with  dementia two years ago, placed mom in facility about 1 yr ago. What is important to pt : Going back to church and Family is important to patient; though there have been family stressors with siblings.   THE FOLLOW WAS DISCUSSED:Current stressors; coping skills, treatment plan and goals.   GOALS ADDRESSED: Get back to old self;  get out of "the funk" ; Wants to enjoy doing things again.  ASSESSMENT/ RECOMMENDATION :  Pt currently experiencing anxiety and mild depression. Symptoms exacerbated by psychosocial stressors of family, placing mom in facility and worrying about her health.  Pt may benefit from and is in agreement to receive further assessment and therapeutic interventions with [facility] to assist with managing her symptoms.  Behavioral Health Intervention: Supportive Counseling, Reflective listening and Relaxed breathing.  Psycho-Education on depression, anxiety & sleep hygiene   PLAN: 1. Patient will F/U with LCSW : in two weeks 2. LCSW will F/U with patient via phone call in one week [phone]  3. Behavioral Health meds: will start taking Zoloft as prescribed by PCP 4. Patient will work on the following behavioral recommendations: Read educational material provided; relaxed breathing, sleep hygiene check list, complete depression self-care action plan. 5. Scale of 1-10, how likely are you to follow plan and implement Interventions: 7  (" can get to an 8 if I can just get started").  Warm Hand Off Completed.      Regina Lanius, LCSW Licensed Clinical Social Worker [facility]   [phone] 3:05 PM

## 2016-03-19 ENCOUNTER — Telehealth: Payer: Self-pay | Admitting: Family Medicine

## 2016-03-19 LAB — CYTOLOGY - PAP
DIAGNOSIS: NEGATIVE
HPV (WINDOPATH): NOT DETECTED

## 2016-03-19 NOTE — Telephone Encounter (Signed)
Contacted concerning lab results.

## 2016-03-25 ENCOUNTER — Ambulatory Visit (HOSPITAL_COMMUNITY): Payer: BC Managed Care – PPO

## 2016-03-26 ENCOUNTER — Telehealth: Payer: Self-pay | Admitting: Licensed Clinical Social Worker

## 2016-03-26 NOTE — Progress Notes (Signed)
[facility] Follow up call to patient.   Patient has not been able to work on any of the interventions discussed this week.  LCSW reminded patient about relaxed breathing.  Patient is in agreement to will work on relaxed breathing.  Will plan to see LCSW during office visit next week.   Casimer Lanius, LCSW Licensed Clinical Social Worker [place]   [phone] 4:34 PM

## 2016-03-29 ENCOUNTER — Ambulatory Visit (HOSPITAL_COMMUNITY): Payer: BC Managed Care – PPO

## 2016-04-01 ENCOUNTER — Ambulatory Visit: Payer: BC Managed Care – PPO

## 2016-04-05 ENCOUNTER — Ambulatory Visit (HOSPITAL_COMMUNITY): Admission: RE | Admit: 2016-04-05 | Payer: BC Managed Care – PPO | Source: Ambulatory Visit

## 2016-04-06 ENCOUNTER — Other Ambulatory Visit: Payer: Self-pay | Admitting: Family Medicine

## 2016-04-07 ENCOUNTER — Institutional Professional Consult (permissible substitution): Payer: BC Managed Care – PPO | Admitting: Pulmonary Disease

## 2016-04-08 ENCOUNTER — Ambulatory Visit: Payer: BC Managed Care – PPO | Admitting: Family Medicine

## 2016-04-12 ENCOUNTER — Telehealth: Payer: Self-pay | Admitting: Licensed Clinical Social Worker

## 2016-04-12 NOTE — Progress Notes (Signed)
Patient did not show for [facility] appointment, left voice messages for LCSW explaining why appointment was missed.   LCSW returned call.  Patient would like to reschedule [facility] appointment next week.     Appointment scheduled April 5th at 10:00.   Casimer Lanius, LCSW Licensed Clinical Social Worker [place]   [phone] 10:35 AM

## 2016-04-15 ENCOUNTER — Other Ambulatory Visit: Payer: Self-pay | Admitting: Family Medicine

## 2016-04-19 ENCOUNTER — Ambulatory Visit: Payer: BC Managed Care – PPO | Admitting: Family Medicine

## 2016-04-22 ENCOUNTER — Ambulatory Visit: Payer: BC Managed Care – PPO

## 2016-05-09 ENCOUNTER — Other Ambulatory Visit: Payer: Self-pay | Admitting: Family Medicine

## 2016-05-31 ENCOUNTER — Other Ambulatory Visit: Payer: Self-pay | Admitting: Family Medicine

## 2016-06-17 ENCOUNTER — Encounter: Payer: Self-pay | Admitting: Licensed Clinical Social Worker

## 2016-06-17 ENCOUNTER — Encounter: Payer: Self-pay | Admitting: Family Medicine

## 2016-06-17 ENCOUNTER — Ambulatory Visit (INDEPENDENT_AMBULATORY_CARE_PROVIDER_SITE_OTHER): Payer: BC Managed Care – PPO | Admitting: Family Medicine

## 2016-06-17 VITALS — BP 140/76 | HR 71 | Temp 97.7°F | Ht 68.0 in | Wt 330.6 lb

## 2016-06-17 DIAGNOSIS — N950 Postmenopausal bleeding: Secondary | ICD-10-CM

## 2016-06-17 MED ORDER — ALPRAZOLAM 0.25 MG PO TABS: ORAL_TABLET | ORAL | 0 refills | Status: DC

## 2016-06-17 NOTE — Progress Notes (Signed)
Subjective:     Patient ID: Regina Burns, female   DOB: 1955-09-25, 61 y.o.   MRN: [id number]  HPI [name] is a 61yo female presenting today for endometrial biopsy. Very anxious on arrival and does not think she can do procedure today. Denies any vaginal bleeding since last office visit in March. Went through menopause many years ago, but has had spotting 2-3 times per year for the last several years. She was too scared to mention it to a doctor until 03/2016 given her concerns it may be cancer. Anxiety resolves around it being cancer and her not getting it evaluated soon enough. Did not go for [place] as scheduled. Did not start Zoloft. Did not follow up with [facility] since her mother passed away around the time they were trying to schedule it. Former smoker.  Review of Systems Per HPI    Objective:   Physical Exam  Constitutional: She appears well-developed and well-nourished.  HENT:  Head: Normocephalic and atraumatic.  Pulmonary/Chest: Effort normal. No respiratory distress.  Psychiatric:  Anxious appearing, almost tearful at times      Assessment and Plan:     1. Postmenopausal bleeding No bleeding since last office visit. Too anxious to have Endometrial Biopsy today as planned. Did not go for US--US scheduled for 6/11 and discussed importance of going. Endometrial biopsy scheduled for 6/14. Xanax given to take 1hr before. Recommended starting Zoloft as prescribed. [facility] to see today--please see their note. Recommend using Relaxed Breathing Technique during procedure.

## 2016-06-17 NOTE — Patient Instructions (Signed)
Thank you so much for coming to visit today! Please go for ultrasound as scheduled. Schedule a visit with me within the next month. If it cannot be done within one month, please schedule with the [facility] here at [facility].  Take two tablets of Xanax 1hr prior to the procedure. Do not drive after taking it.   Dr. Gerlean Ren   Endometrial Biopsy Endometrial biopsy is a procedure in which a tissue sample is taken from inside the uterus. The sample is taken from the endometrium, which is the lining of the uterus. The tissue sample is then checked under a microscope to see if the tissue is normal or abnormal. This procedure helps to determine where you are in your menstrual cycle and how hormone levels are affecting the lining of the uterus. This procedure may also be used to evaluate uterine bleeding or to diagnose endometrial cancer, endometrial tuberculosis, polyps, or other inflammatory conditions. Tell a health care provider about:  Any allergies you have.  All medicines you are taking, including vitamins, herbs, eye drops, creams, and over-the-counter medicines.  Any problems you or family members have had with anesthetic medicines.  Any blood disorders you have.  Any surgeries you have had.  Any medical conditions you have.  Whether you are pregnant or may be pregnant. What are the risks? Generally, this is a safe procedure. However, problems may occur, including:  Bleeding.  Pelvic infection.  Puncture of the wall of the uterus with the biopsy device (rare). What happens before the procedure?  Keep a record of your menstrual cycles as told by your health care provider. You may need to schedule your procedure for a specific time in your cycle.  You may want to bring a sanitary pad to wear after the procedure.  Ask your health care provider about: ? Changing or stopping your regular medicines. This is especially important if you are taking diabetes medicines or blood  thinners. ? Taking medicines such as aspirin and ibuprofen. These medicines can thin your blood. Do not take these medicines before your procedure if your health care provider instructs you not to.  Plan to have someone take you home from the hospital or clinic. What happens during the procedure?  To lower your risk of infection: ? Your health care team will wash or sanitize their hands.  You will lie on an exam table with your feet and legs supported as in a pelvic exam.  Your health care provider will insert an instrument (speculum) into your vagina to see your cervix.  Your cervix will be cleansed with an antiseptic solution.  A forceps instrument (tenaculum) will be used to hold your cervix steady for the biopsy.  A thin, rod-like instrument (uterine sound) will be inserted through your cervix to determine the length of your uterus and the location where the biopsy sample will be removed.  A thin, flexible tube (catheter) will be inserted through your cervix and into the uterus. The catheter will be used to collect the biopsy sample from your endometrial tissue.  The catheter and speculum will then be removed, and the tissue sample will be sent to a lab for examination. What happens after the procedure?  You will rest in a recovery area until you are ready to go home.  You may have mild cramping and a small amount of vaginal bleeding. This is normal.  It is up to you to get the results of your procedure. Ask your health care provider, or the department  that is doing the procedure, when your results will be ready. Summary  Endometrial biopsy is a procedure in which a tissue sample is taken from the endometrium, which is the lining of the uterus.  This procedure may help to diagnose menstrual cycle problems, abnormal bleeding, or other conditions affecting the endometrium.  Before the procedure, keep a record of your menstrual cycles as told by your health care provider.  The  tissue sample that is removed will be checked under a microscope to see if it is normal or abnormal. This information is not intended to replace advice given to you by your health care provider. Make sure you discuss any questions you have with your health care provider. Document Released: 05/07/2004 Document Revised: 01/21/2016 Document Reviewed: 01/21/2016 Elsevier Interactive Patient Education  2017 [employer].

## 2016-06-17 NOTE — Progress Notes (Signed)
Total time:15 minutes Type of Service: [place] Name and Language:NA Number of visits:2/10   SUBJECTIVE: Regina Burns is a 61 y.o. female  Patient was referred by Dr. Gerlean Ren for:  anxiety patient  Patient reports the following : anxiety, depression, fatigue and loss of interest in favorite activities, and inability to focus.  She does report she has started eating less junk food and more vegetables.   Reason for follow-up: Continue brief intervention to address symptoms associated with depression and anxiety.  Reports minimal impairment in daily functions with job but still spends a lot of time just not knowing how to put her feelings into words. Patient also reported her mother passes away on 04/17/22.  Patient did not start zoloft from last visit with PCP.  LCSW discussed reservation and ambivalence with starting the medication.  Also reviewed relaxation techniques.   Identified goals 1.wants to feel better 2. Loose weight 3.eat healthier   Intervention: Reflective listening, Behavioral Therapy (Relaxed breathing), Supportive Counseling         PLAN: 1.. LCSW will F/U with patient via phone call in 3 to 5 business days [phone] 3. Behavioral recommendations: relaxed breathing 3 times a day 4.. Referral: none at this time 5.  Start taking zoloft as prescribed by PCP 6. From scale of 1-10, how likely are you to follow plan: 10  Warm Hand Off Completed.     Casimer Lanius, LCSW Licensed Clinical Social Worker [facility]   [phone] 2:56 PM

## 2016-06-22 ENCOUNTER — Telehealth: Payer: Self-pay | Admitting: Licensed Clinical Social Worker

## 2016-06-22 NOTE — Progress Notes (Signed)
[facility] f/u phone call to patient reference interventions discussed during joint visit with PCP.  Patient reports she has not started taking the Zoloft.  Per patient she is afraid of what she has read in ref. to the side affects of the medication.   Informed LCSW she is doing the relaxed breathing several times a day and it seems to help. LCSW will provide an update to PCP.  Plan: Patient indicated she is going to call PCP to discuss other medication options for her depression and axiety.  Casimer Lanius, LCSW Licensed Clinical Social Worker [facility]   [phone] 10:22 AM

## 2016-06-28 ENCOUNTER — Ambulatory Visit (HOSPITAL_COMMUNITY)
Admission: RE | Admit: 2016-06-28 | Discharge: 2016-06-28 | Disposition: A | Discharge status: Home / Self Care | Payer: BC Managed Care – PPO | Source: Ambulatory Visit | Attending: Family Medicine | Admitting: Family Medicine

## 2016-06-28 DIAGNOSIS — R938 Abnormal findings on diagnostic imaging of other specified body structures: Secondary | ICD-10-CM | POA: Diagnosis not present

## 2016-06-28 DIAGNOSIS — D259 Leiomyoma of uterus, unspecified: Secondary | ICD-10-CM | POA: Diagnosis not present

## 2016-06-28 DIAGNOSIS — Z124 Encounter for screening for malignant neoplasm of cervix: Secondary | ICD-10-CM

## 2016-06-28 DIAGNOSIS — N950 Postmenopausal bleeding: Secondary | ICD-10-CM | POA: Diagnosis not present

## 2016-07-01 ENCOUNTER — Encounter: Payer: Self-pay | Admitting: Family Medicine

## 2016-07-01 ENCOUNTER — Ambulatory Visit (INDEPENDENT_AMBULATORY_CARE_PROVIDER_SITE_OTHER): Payer: BC Managed Care – PPO | Admitting: Family Medicine

## 2016-07-01 VITALS — BP 130/68 | HR 61 | Temp 98.1°F | Ht 68.0 in | Wt 325.6 lb

## 2016-07-01 DIAGNOSIS — N950 Postmenopausal bleeding: Secondary | ICD-10-CM | POA: Diagnosis not present

## 2016-07-01 NOTE — Patient Instructions (Signed)

## 2016-07-04 NOTE — Progress Notes (Signed)
Subjective:     Patient ID: Regina Burns, female   DOB: [birth year], 61 y.o.   MRN: [id number]  HPI Regina Burns is a 61yo female presenting today for Endometrial Biopsy. Denies any changes since last office visit. Initially presented on 5/31, but was too anxious to proceed with procedure. 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 Did bring a family friend for support. Took Ibuprofen 1hr prior to presentation today.  Was initially scheduled for Endometrial biopsy due to several year history of post-menopausal bleeding.States when it first started, she was scared it was cancer and was too afraid to tell a medical provider. Usually has bleeding 2-3 times per year. Went for Pelvic US, which showed endometrial thickness of 15.22mm, 3.5cm fibroid. Former Smoker.  Review of Systems Per HPI    Objective:   Physical Exam  Constitutional: She appears well-developed and well-nourished. No distress.  Cardiovascular: Normal rate and regular rhythm.   No murmur heard. Pulmonary/Chest: Effort normal. No respiratory distress. She has no wheezes.  Genitourinary: No vaginal discharge found.  Genitourinary Comments: No vaginal bleeding.   Psychiatric: She has a normal mood and affect. Her behavior is normal.      Assessment and Plan:     1. Postmenopausal bleeding Endometrial Biopsy performed today. Much more relaxed today with family friend present for support.  Risks and benefits discussed and written consent obtained. Bimanual exam performed. Sterile speculum placed. Cervix sterilized with betadine. Uterine sound advanced through cervix to 7cm. Tenaculum not needed. Pipelle advanced through cervix to 7cm. Suction applied and sample obtained. Initial sample determined to not be sufficient and Pipelle once again advanced and further samples obtained. Speculum removed. No complications noted.  Handout on after-care of Endometrial Biopsy  given. To notify office if she develops significant bleeding or fever. Follow up pending results.

## 2016-07-07 ENCOUNTER — Telehealth: Payer: Self-pay | Admitting: Family Medicine

## 2016-07-07 DIAGNOSIS — R9389 Abnormal findings on diagnostic imaging of other specified body structures: Secondary | ICD-10-CM

## 2016-07-07 NOTE — Telephone Encounter (Signed)
Discussed with Ob/Gyn. Recommend referral to Ob/Gyn for possible D&C given thickness of endometrial lining and endometrial polyp noted on biopsy. Referral placed. Contacted Mrs. Israel-McPhee to discuss with her.  Dr. Gerlean Ren

## 2016-07-16 ENCOUNTER — Other Ambulatory Visit: Payer: Self-pay | Admitting: Family Medicine

## 2016-07-20 ENCOUNTER — Ambulatory Visit: Payer: Self-pay | Admitting: Obstetrics & Gynecology

## 2016-07-22 ENCOUNTER — Telehealth: Payer: Self-pay | Admitting: Student in an Organized Health Care Education/Training Program

## 2016-07-22 NOTE — Telephone Encounter (Signed)
Pt tried to get her gabapentin Rx the other day and was told it was too early. She is taking 3 pills once a day.  She had discussed this with dr Gerlean Ren.  Please straighen the dosage out the [facility] at [address]

## 2016-07-24 ENCOUNTER — Other Ambulatory Visit: Payer: Self-pay | Admitting: Student in an Organized Health Care Education/Training Program

## 2016-07-24 MED ORDER — GABAPENTIN 100 MG PO CAPS: 100.0000 mg | ORAL_CAPSULE | Freq: Every day | ORAL | 0 refills | Status: DC

## 2016-07-24 NOTE — Telephone Encounter (Signed)
Please call and inform patient that gabapentin was refilled at [facility] [address].  Thank you, Everrett Coombe MD

## 2016-07-26 NOTE — Telephone Encounter (Signed)
Called, phone rang with no answer, no voicemail. Ottis Stain, CMA

## 2016-07-27 NOTE — Telephone Encounter (Signed)
Pt called the pharmacy. She can not pick up the gabapentin because the directions say 1 at bed time but she takes 3 at bedtime. Pharmacy/Insurance said it's too early to pick up. Pt would like directions changed to 3 at bedtime. Please advise and let pt know the outcome. Please call pt on [phone]. Ottis Stain, CMA

## 2016-07-28 ENCOUNTER — Other Ambulatory Visit: Payer: Self-pay | Admitting: Student in an Organized Health Care Education/Training Program

## 2016-07-28 MED ORDER — GABAPENTIN 100 MG PO CAPS: 300.0000 mg | ORAL_CAPSULE | Freq: Every day | ORAL | 0 refills | Status: DC

## 2016-08-09 ENCOUNTER — Other Ambulatory Visit: Payer: Self-pay | Admitting: *Deleted

## 2016-08-09 MED ORDER — LOVASTATIN 40 MG PO TABS: 40.0000 mg | ORAL_TABLET | Freq: Every day | ORAL | 0 refills | Status: DC

## 2016-08-12 ENCOUNTER — Encounter: Payer: Self-pay | Admitting: Obstetrics & Gynecology

## 2016-08-12 ENCOUNTER — Ambulatory Visit (INDEPENDENT_AMBULATORY_CARE_PROVIDER_SITE_OTHER): Payer: BC Managed Care – PPO | Admitting: Obstetrics & Gynecology

## 2016-08-12 VITALS — BP 134/74 | Ht 67.75 in | Wt 327.6 lb

## 2016-08-12 DIAGNOSIS — N840 Polyp of corpus uteri: Secondary | ICD-10-CM

## 2016-08-12 DIAGNOSIS — N950 Postmenopausal bleeding: Secondary | ICD-10-CM

## 2016-08-12 NOTE — Patient Instructions (Signed)
1. Endometrial polyp Benign Endometrial Polyp with atrophic and metaplastic changes per EBx performed 07/01/2016.  Results reviewed with patient.  Given that the Polyp may or may not have been completely removed with the EBx and that other Polyps may be present, the decision was made to proceed with a Sonohystogram to assess the IU cavity.  If any lesion is present in the IU cavity, will proceed with Hysteroscopy, resection, D+C in an outpatient setting.   - [place] Sonohysterogram; Future  2. Postmenopausal bleeding Probably due to Endometrial Polyp. - [place] Sonohysterogram; Future  Regina Burns, it was a pleasure to meet you today!  I will see you soon for the Sonohysterogam.   Sonohysterogram A sonohysterogram is a procedure to examine the inside of the uterus. This exam uses sound waves that are sent to a computer to make images of the lining of the uterus (endometrium). To get the best images, a germ-free, salt-water solution (sterile saline) is put into the uterus through the vagina. You may have this procedure if you have certain reproductive problems, such as abnormal bleeding, infertility, or miscarriage. This procedure can show what may be causing these problems. Possible causes include scarring or abnormal growths such as fibroids inside your uterus. It can also show if your uterus is an abnormal shape or if the lining of the uterus is too thin. Tell a health care provider about:  All medicines you are taking, including vitamins, herbs, eye drops, creams, and over-the-counter medicines.  Any allergies you have.  Any blood disorders you have.  Any surgeries you have had.  Any medical conditions you have.  Whether you are pregnant or may be pregnant.  The date of the first day of your last period.  Any signs of infection, such as fever, pain in your lower abdomen, or abnormal discharge from your vagina. What are the risks? Generally, this is a safe procedure. However, problems may occur,  including:  Abdominal pain or cramping.  Light bleeding (spotting).  Increased vaginal discharge.  Infection.  What happens before the procedure?  Your health care provider may have you take an over-the-counter pain medicine.  You may be given medicine to stop any abnormal bleeding.  You may be given antibiotic medicine to help prevent infection.  You may be asked to take a pregnancy test. This is usually in the form of a urine test.  You may have a pelvic exam.  You will be asked to empty your bladder. What happens during the procedure?  You will lie down on the exam table with your feet in stirrups or with your knees bent and your feet flat on the table.  A slender, handheld device (transducer) will be lubricated and placed into your vagina.  The transducer will be positioned to send sound waves to your uterus. The sound waves are sent to a computer and are turned into images, which your health care provider sees during the procedure.  The transducer will be removed from your vagina.  An instrument will be inserted to widen the opening of your vagina (speculum).  A swab with germ-killing solution (antiseptic) will be used to clean the opening to your uterus (cervix).  A long, thin tube (catheter) will be placed through your cervix into your uterus.  The speculum will be removed.  The transducer will be placed back into your vagina to take more images.  Your uterus will be filled with a germ-free, salt-water solution (sterile saline) through the catheter. You may feel some cramping.  A fluid that contains air bubbles may be sent through the catheter to make it easier to see the fallopian tubes.  The transducer and catheter will be removed. The procedure may vary among health care providers and hospitals. What happens after the procedure?  It is up to you to get the results of your procedure. Ask your health care provider, or the department that is doing the  procedure, when your results will be ready. Summary  A sonohysterogram is a procedure that creates images of the inside of the uterus.  The risks of this procedure are very low. Most women experience cramping and spotting after the procedure.  You may need to have a pelvic exam and take a pregnancy test before this procedure. This procedure will not be done if you are pregnant or have an infection. This information is not intended to replace advice given to you by your health care provider. Make sure you discuss any questions you have with your health care provider. Document Released: 05/21/2013 Document Revised: 12/01/2015 Document Reviewed: 12/01/2015 Elsevier Interactive Patient Education  2017 [employer].

## 2016-08-12 NOTE — Progress Notes (Signed)
    Regina Burns 08-01-1955 [id number]        61 y.o.  [id number]   RP:  PMB with Endometrial Polyp on benign EBx  HPI:  Menopause.  No HRT.  Per patient, would have mild spotting once or twice a year for a few years.  No pelvic Pain.  Obesity BMI 50.18.  Had a Pelvic US 06/28/2016 which showed a thickened Endometrial line at 15.6 mm.  An Endometrial Bx was performed on 07/01/2016 showing:  -ENDOMETRIAL POLYP WITH ATROPHIC AND METAPLASTIC CHANGES. - ATROPHIC ENDOMETRIUM WITH BREAKDOWN. - NO HYPERPLASIA, ATYPIA OR MALIGNANCY IDENTIFIED.  Patient was therefore referred by [facility] DO for management of the Endometrial Polyp.  Last Pap test wnl 03/2016.  Patient overdue for screening Mammo, will schedule.  Past medical history,surgical history, problem list, medications, allergies, family history and social history were all reviewed and documented in the EPIC chart.  Directed ROS with pertinent positives and negatives documented in the history of present illness/assessment and plan.  Exam:  Vitals:   08/12/16 1016  BP: 134/74  Weight: (!) 327 lb 9.6 oz (148.6 kg)  Height: 5' 7.75" (1.721 m)   General appearance:  Normal  Gyn exam:  Deferred to next visit  Assessment/Plan:  61 y.o. [id number]   1. Endometrial polyp Benign Endometrial Polyp with atrophic and metaplastic changes per EBx performed 07/01/2016.  Results reviewed with patient.  Given that the Polyp may or may not have been completely removed with the EBx and that other Polyps may be present, the decision was made to proceed with a Sonohysterogram to assess the IU cavity.  If any lesion is present in the IU cavity, will proceed with Hysteroscopy, resection, D+C in an outpatient setting.   - [place] Sonohysterogram; Future  2. Postmenopausal bleeding Probably due to Endometrial Polyp. - [place] Sonohysterogram; Future  Counseling on above issues >50% x 30 minutes.  Princess Bruins MD, 10:26 AM 08/12/2016

## 2016-08-27 ENCOUNTER — Other Ambulatory Visit: Payer: Self-pay | Admitting: *Deleted

## 2016-09-01 ENCOUNTER — Other Ambulatory Visit: Payer: Self-pay | Admitting: Obstetrics & Gynecology

## 2016-09-01 DIAGNOSIS — N950 Postmenopausal bleeding: Secondary | ICD-10-CM

## 2016-09-01 MED ORDER — AMLODIPINE BESYLATE 10 MG PO TABS: 10.0000 mg | ORAL_TABLET | Freq: Every day | ORAL | 0 refills | Status: DC

## 2016-09-15 ENCOUNTER — Ambulatory Visit (INDEPENDENT_AMBULATORY_CARE_PROVIDER_SITE_OTHER): Payer: BC Managed Care – PPO | Admitting: Obstetrics & Gynecology

## 2016-09-15 ENCOUNTER — Ambulatory Visit (INDEPENDENT_AMBULATORY_CARE_PROVIDER_SITE_OTHER): Payer: BC Managed Care – PPO

## 2016-09-15 DIAGNOSIS — N950 Postmenopausal bleeding: Secondary | ICD-10-CM | POA: Diagnosis not present

## 2016-09-15 DIAGNOSIS — N840 Polyp of corpus uteri: Secondary | ICD-10-CM

## 2016-09-15 NOTE — Progress Notes (Signed)
    Regina Burns 31-Oct-1955 [id number]        61 y.o.  [id number]  RP:  PMB with Endometrial Polyp on benign EBx for Sonohysterogram today  HPI:  Menopause.  No HRT.  Per patient, would have mild spotting once or twice a year for a few years.  No pelvic Pain.  Obesity BMI 50.18.  Had a Pelvic US 06/28/2016 which showed a thickened Endometrial line at 15.6 mm.  An Endometrial Bx was performed on 07/01/2016 showing:  -ENDOMETRIAL POLYP WITH ATROPHIC AND METAPLASTIC CHANGES. - ATROPHIC ENDOMETRIUM WITH BREAKDOWN. - NO HYPERPLASIA, ATYPIA OR MALIGNANCY IDENTIFIED.   Past medical history,surgical history, problem list, medications, allergies, family history and social history were all reviewed and documented in the EPIC chart.  Directed ROS with pertinent positives and negatives documented in the history of present illness/assessment and plan.  Exam:  There were no vitals filed for this visit. General appearance:  Normal                                                                    Sono Infusion Hysterogram ( procedure note)  The initial transvaginal ultrasound demonstrated the following:  T/V and T/A Retroverted Uterus 8.44 x 5.38 x 5.44 cm with Thickened Endometrial line at 18.5 mm with cystic/solid Echogenicity.  Fundal Intramural Fibroid 3.3 x 2.5 cm.  Right and left Ovaries normal.  No FF in CDS.  The speculum  was inserted and the cervix cleansed with Betadine solution after confirming that patient has no allergies.  The cervix was sprayed with Hurricane.  The cervix was grasped with a tenaculum.  A small sonohysterography catheter was utilized.  Insertion was facilitated with ring forceps, using a spear-like motion the catheter was inserted to the fundus of the uterus. The speculum and tenaculum were then removed carefully to avoid dislodging the catheter. The catheter was flushed with sterile saline delete prior to insertion to rid it of small amounts of air.the sterile saline  solution was infused into the uterine cavity as a vaginal ultrasound probe was then placed in the vagina for full visualization of the uterine cavity from a transvaginal approach. The following was noted:  Endometrial cavity filled with saline revealing a 2.8 x 2.2 cm defect.  The catheter was then removed after retrieving some of the saline from the intrauterine cavity. An endometrial biopsy was not done. Patient tolerated procedure well. She had received a tablet of Aleve for discomfort.   Assessment/Plan:  61 y.o. [id number]   1. Endometrial polyp Large IU lesion c/w an Endometrial Polyp measuring 2.8 x 2.2 cm.  Patient informed of findings.  Will proceed with Hysteroscopy with Myosure excision of Polyp and D+C.  Organizing surgery.  F/U Preop for informed consent.  Counseling on above issue >50% x 15 minutes.  Princess Bruins MD, 10:28 AM 09/15/2016

## 2016-09-15 NOTE — Patient Instructions (Signed)
1. Endometrial polyp Large IU lesion c/w an Endometrial Polyp measuring 2.8 x 2.2 cm.  Patient informed of findings.  Will proceed with Hysteroscopy with Myosure excision of Polyp and D+C.  Organizing surgery.  F/U Preop for informed consent.  Hysteroscopy Hysteroscopy is a procedure used for looking inside the womb (uterus). It may be done for various reasons, including:  To evaluate abnormal bleeding, fibroid (benign, noncancerous) tumors, polyps, scar tissue (adhesions), and possibly cancer of the uterus.  To look for lumps (tumors) and other uterine growths.  To look for causes of why a woman cannot get pregnant (infertility), causes of recurrent loss of pregnancy (miscarriages), or a lost intrauterine device (IUD).  To perform a sterilization by blocking the fallopian tubes from inside the uterus.  In this procedure, a thin, flexible tube with a tiny light and camera on the end of it (hysteroscope) is used to look inside the uterus. A hysteroscopy should be done right after a menstrual period to be sure you are not pregnant. LET [facility] CARE PROVIDER KNOW ABOUT:  Any allergies you have.  All medicines you are taking, including vitamins, herbs, eye drops, creams, and over-the-counter medicines.  Previous problems you or members of your family have had with the use of anesthetics.  Any blood disorders you have.  Previous surgeries you have had.  Medical conditions you have. RISKS AND COMPLICATIONS Generally, this is a safe procedure. However, as with any procedure, complications can occur. Possible complications include:  Putting a hole in the uterus.  Excessive bleeding.  Infection.  Damage to the cervix.  Injury to other organs.  Allergic reaction to medicines.  Too much fluid used in the uterus for the procedure.  BEFORE THE PROCEDURE  Ask your health care provider about changing or stopping any regular medicines.  Do not take aspirin or blood thinners for 1  week before the procedure, or as directed by your health care provider. These can cause bleeding.  If you smoke, do not smoke for 2 weeks before the procedure.  In some cases, a medicine is placed in the cervix the day before the procedure. This medicine makes the cervix have a larger opening (dilate). This makes it easier for the instrument to be inserted into the uterus during the procedure.  Do not eat or drink anything for at least 8 hours before the surgery.  Arrange for someone to take you home after the procedure. PROCEDURE  You may be given a medicine to relax you (sedative). You may also be given one of the following: ? A medicine that numbs the area around the cervix (local anesthetic). ? A medicine that makes you sleep through the procedure (general anesthetic).  The hysteroscope is inserted through the vagina into the uterus. The camera on the hysteroscope sends a picture to a TV screen. This gives the surgeon a good view inside the uterus.  During the procedure, air or a liquid is put into the uterus, which allows the surgeon to see better.  Sometimes, tissue is gently scraped from inside the uterus. These tissue samples are sent to a lab for testing. What to expect after the procedure  If you had a general anesthetic, you may be groggy for a couple hours after the procedure.  If you had a local anesthetic, you will be able to go home as soon as you are stable and feel ready.  You may have some cramping. This normally lasts for a couple days.  You may have  bleeding, which varies from light spotting for a few days to menstrual-like bleeding for 3-7 days. This is normal.  If your test results are not back during the visit, make an appointment with your health care provider to find out the results. This information is not intended to replace advice given to you by your health care provider. Make sure you discuss any questions you have with your health care provider. Document  Released: 04/12/2000 Document Revised: 06/12/2015 Document Reviewed: 08/03/2012 Elsevier Interactive Patient Education  2017 [employer].

## 2016-11-12 ENCOUNTER — Other Ambulatory Visit: Payer: Self-pay | Admitting: Student in an Organized Health Care Education/Training Program

## 2016-11-15 MED ORDER — GABAPENTIN 100 MG PO CAPS: 300.0000 mg | ORAL_CAPSULE | Freq: Every day | ORAL | 0 refills | Status: DC

## 2016-12-01 ENCOUNTER — Other Ambulatory Visit: Payer: Self-pay | Admitting: Student in an Organized Health Care Education/Training Program

## 2017-02-01 ENCOUNTER — Other Ambulatory Visit: Payer: Self-pay | Admitting: Student in an Organized Health Care Education/Training Program

## 2017-02-01 DIAGNOSIS — Z1231 Encounter for screening mammogram for malignant neoplasm of breast: Secondary | ICD-10-CM

## 2017-02-07 ENCOUNTER — Ambulatory Visit: Payer: BC Managed Care – PPO | Admitting: Student in an Organized Health Care Education/Training Program

## 2017-02-15 ENCOUNTER — Ambulatory Visit: Payer: BC Managed Care – PPO | Admitting: Student in an Organized Health Care Education/Training Program

## 2017-02-24 ENCOUNTER — Ambulatory Visit
Admission: RE | Admit: 2017-02-24 | Discharge: 2017-02-24 | Disposition: A | Discharge status: Home / Self Care | Payer: BC Managed Care – PPO | Source: Ambulatory Visit | Attending: *Deleted | Admitting: *Deleted

## 2017-02-24 DIAGNOSIS — Z1231 Encounter for screening mammogram for malignant neoplasm of breast: Secondary | ICD-10-CM

## 2017-02-25 ENCOUNTER — Other Ambulatory Visit: Payer: Self-pay | Admitting: Student in an Organized Health Care Education/Training Program

## 2017-02-25 DIAGNOSIS — N63 Unspecified lump in unspecified breast: Secondary | ICD-10-CM

## 2017-02-28 ENCOUNTER — Other Ambulatory Visit: Payer: Self-pay

## 2017-03-14 ENCOUNTER — Other Ambulatory Visit: Payer: BC Managed Care – PPO

## 2017-03-17 ENCOUNTER — Other Ambulatory Visit: Payer: Self-pay

## 2017-03-18 MED ORDER — GABAPENTIN 100 MG PO CAPS: 300.0000 mg | ORAL_CAPSULE | Freq: Every day | ORAL | 0 refills | Status: DC

## 2017-03-21 ENCOUNTER — Ambulatory Visit
Admission: RE | Admit: 2017-03-21 | Discharge: 2017-03-21 | Disposition: A | Discharge status: Home / Self Care | Payer: BC Managed Care – PPO | Source: Ambulatory Visit | Attending: Family Medicine | Admitting: Family Medicine

## 2017-03-21 ENCOUNTER — Other Ambulatory Visit: Payer: Self-pay | Admitting: Student in an Organized Health Care Education/Training Program

## 2017-03-21 DIAGNOSIS — N63 Unspecified lump in unspecified breast: Secondary | ICD-10-CM

## 2017-03-23 ENCOUNTER — Other Ambulatory Visit: Payer: BC Managed Care – PPO

## 2017-03-23 ENCOUNTER — Other Ambulatory Visit: Payer: Self-pay | Admitting: Student in an Organized Health Care Education/Training Program

## 2017-03-23 DIAGNOSIS — N63 Unspecified lump in unspecified breast: Secondary | ICD-10-CM

## 2017-03-24 ENCOUNTER — Ambulatory Visit
Admission: RE | Admit: 2017-03-24 | Discharge: 2017-03-24 | Disposition: A | Discharge status: Home / Self Care | Payer: BC Managed Care – PPO | Source: Ambulatory Visit | Attending: Family Medicine | Admitting: Family Medicine

## 2017-03-24 DIAGNOSIS — N63 Unspecified lump in unspecified breast: Secondary | ICD-10-CM

## 2017-04-19 ENCOUNTER — Other Ambulatory Visit: Payer: Self-pay

## 2017-04-20 MED ORDER — GABAPENTIN 100 MG PO CAPS: 300.0000 mg | ORAL_CAPSULE | Freq: Every day | ORAL | 0 refills | Status: DC

## 2017-04-24 IMAGING — MR MR KNEE*L* W/O CM
4 of 5 series · 21 of 40 positions shown · non-contrast
Comparison: None.

CLINICAL DATA: Left knee pain.

EXAM:
MRI OF THE LEFT KNEE WITHOUT CONTRAST
TECHNIQUE: Multiplanar, multisequence MR imaging of the knee was performed. No
intravenous contrast was administered.

[Series 3: pd_tse_fs_tra · axial · 3.5mm · 0.42mm/px · z∈[-45,+18]mm · 3 of 22 slices shown]
[im 4/22]
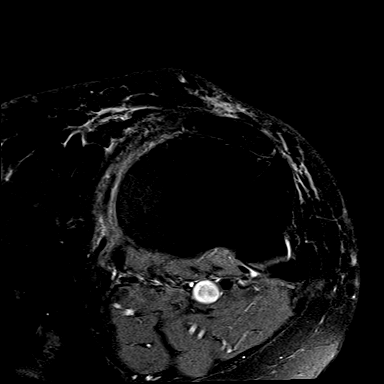
[im 13/22]
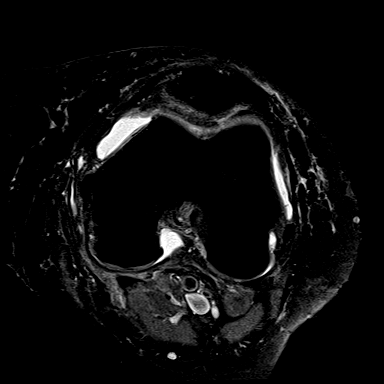
[im 19/22]
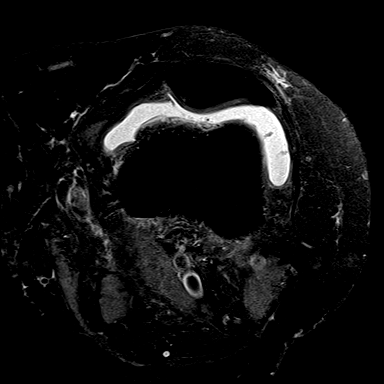

[Series 5: T2 fat-sat · coronal · 3.2mm · 0.62mm/px · 8 of 26 slices shown]
[im 1/26]
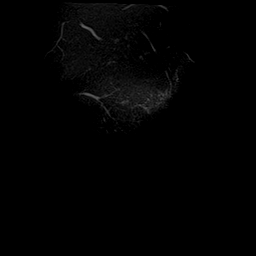
[im 4/26]
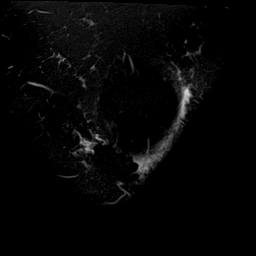
[im 8/26]
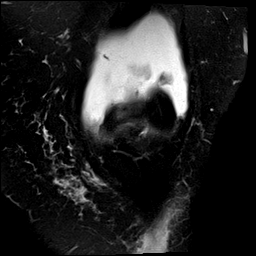
[im 11/26]
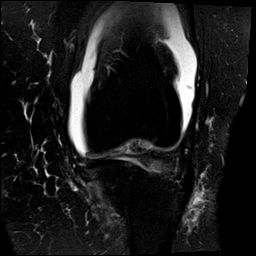
[im 15/26]
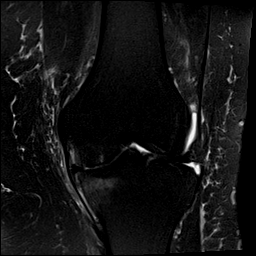
[im 18/26]
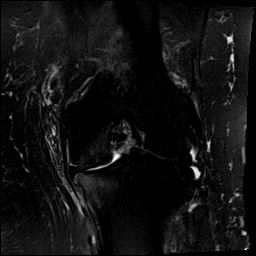
[im 22/26]
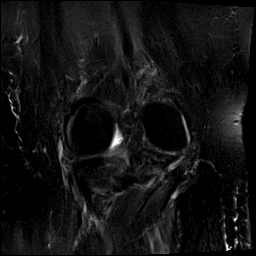
[im 26/26]
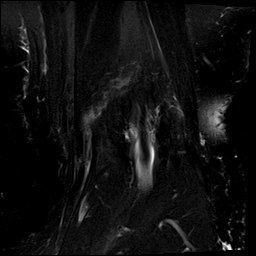

[Series 6: PD fat-sat · sagittal · 3.5mm · 0.25mm/px · 7 of 23 slices shown]
[im 1/23]
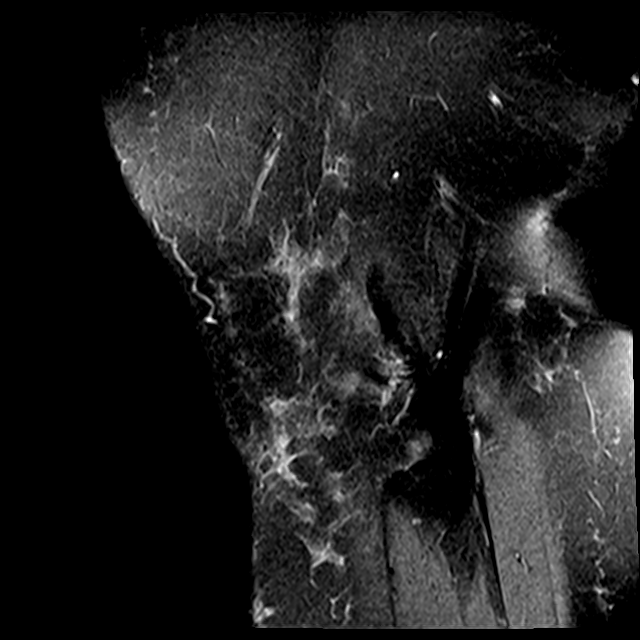
[im 4/23]
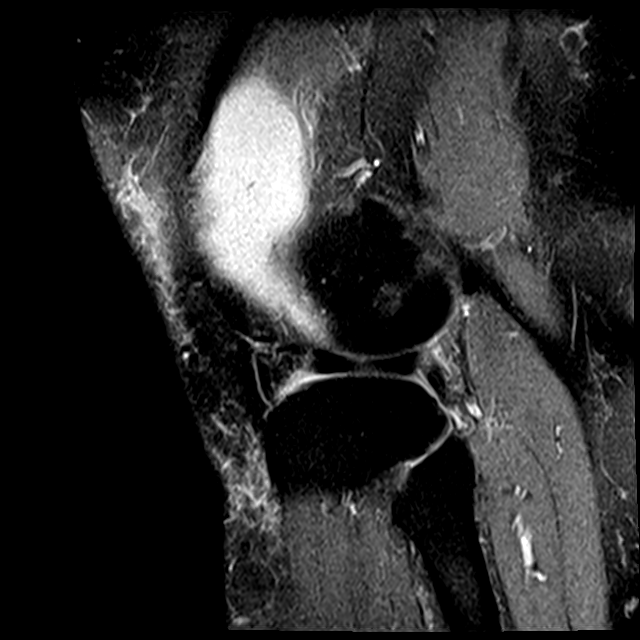
[im 8/23]
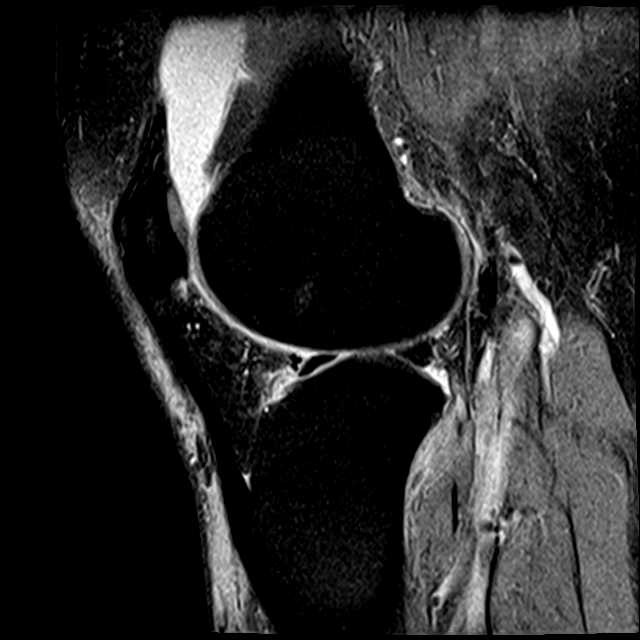
[im 12/23]
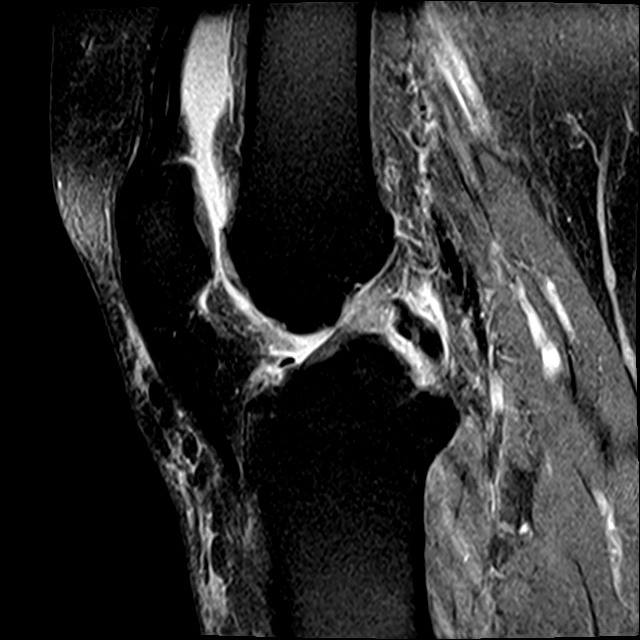
[im 15/23]
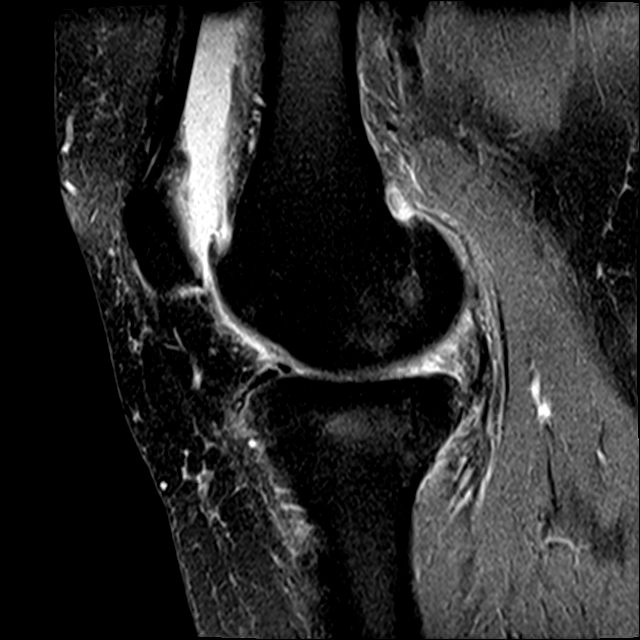
[im 19/23]
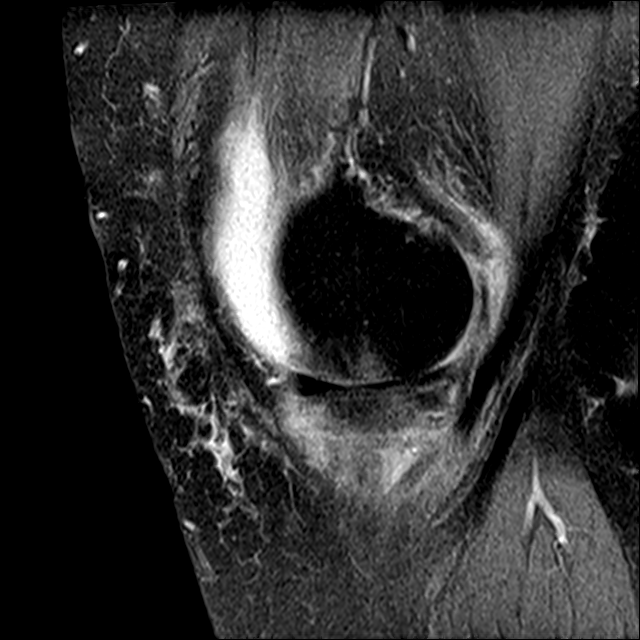
[im 23/23]
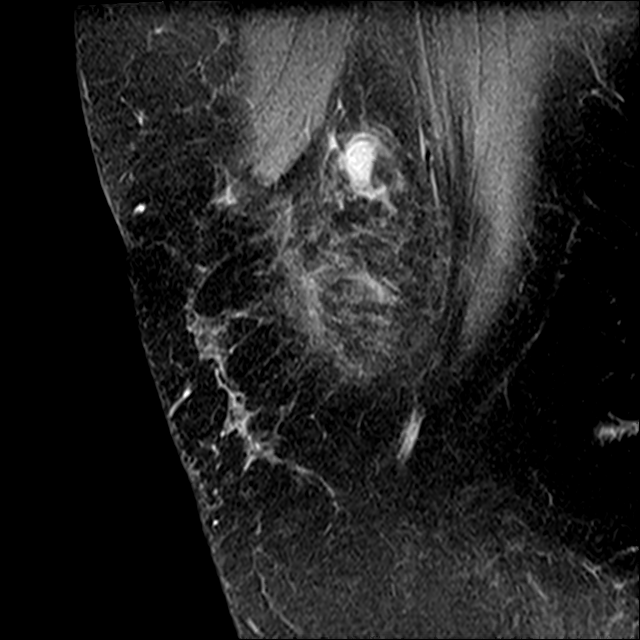

[Series 7: T1 · coronal · 3.2mm · 0.25mm/px · 3 of 26 slices shown]
[im 4/26]
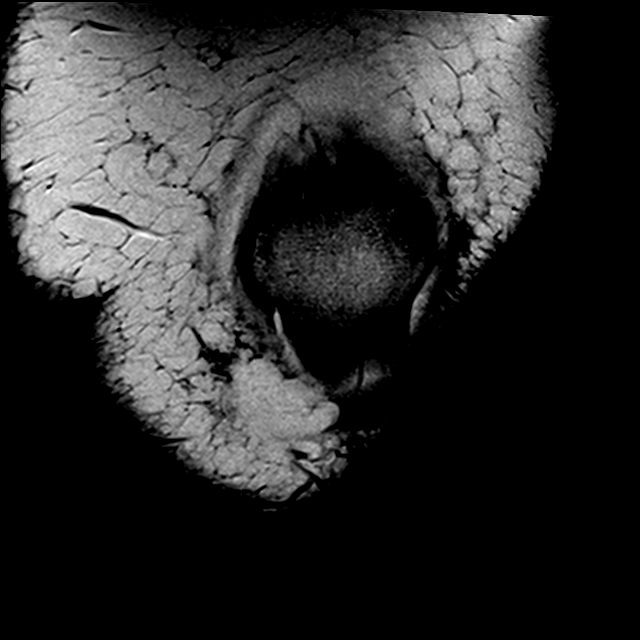
[im 15/26]
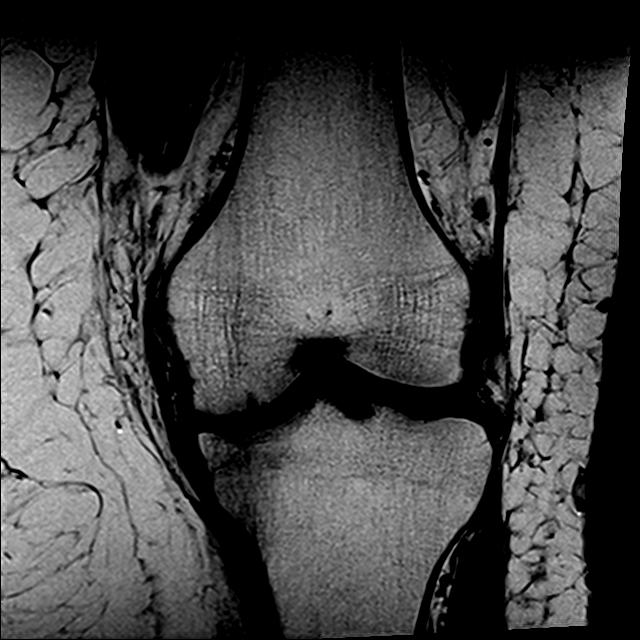
[im 22/26]
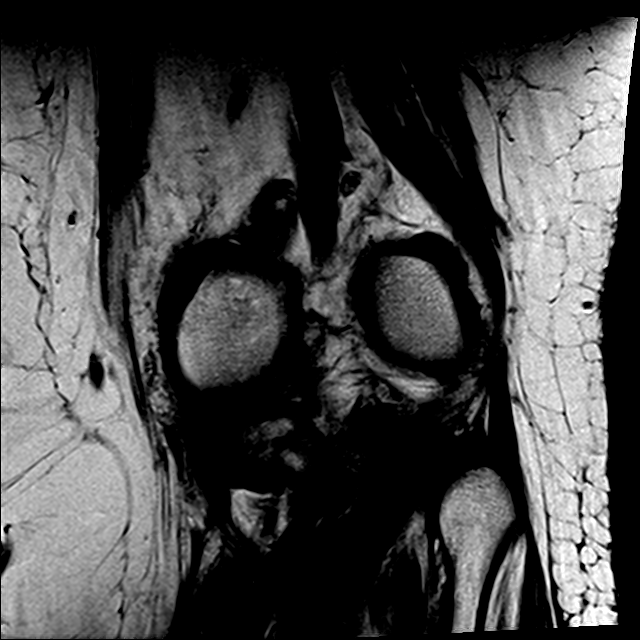

[21 of 40 positions shown; findings below may reference images not displayed]

FINDINGS: MENISCI

Medial meniscus: Radial tear of posterior horn of the medial
meniscus with peripheral meniscal extrusion.

Lateral meniscus: Small radial tear of the free edge of the body of
the lateral meniscus.

LIGAMENTS

Cruciates:  Intact ACL and PCL.

Collaterals: Medial collateral ligament is intact. Lateral
collateral ligament complex is intact.

CARTILAGE

Patellofemoral: Full-thickness cartilage loss of the lateral
trochlea with subchondral reactive marrow changes. Partial-thickness
cartilage loss of the medial patellofemoral compartment.

Medial: High-grade partial-thickness cartilage loss with areas of
full-thickness cartilage loss of the medial femoral condyle medial
tibial plateau with subchondral reactive marrow changes.

Lateral:  No focal chondral defect.

Joint: Large joint effusion. Mild edema in Hoffa's fat. No plical
thickening.

Popliteal Fossa:  Small Baker cyst.  Intact popliteus tendon.

Extensor Mechanism:  Intact.

Bones: No other focal marrow signal abnormality. No fracture or
dislocation.
IMPRESSION: 1. Radial tear of posterior horn of the medial meniscus with
peripheral meniscal extrusion.
2. Small radial tear of the free edge of the body of the lateral
meniscus.
3. Cartilage abnormalities of the patellofemoral compartment and
medial femorotibial compartment as described above. Next large joint
effusion.

## 2017-05-19 ENCOUNTER — Other Ambulatory Visit: Payer: Self-pay | Admitting: *Deleted

## 2017-05-23 ENCOUNTER — Other Ambulatory Visit: Payer: Self-pay | Admitting: Student in an Organized Health Care Education/Training Program

## 2017-05-23 MED ORDER — ATENOLOL-CHLORTHALIDONE 100-25 MG PO TABS: 1.0000 | ORAL_TABLET | Freq: Every day | ORAL | 0 refills | Status: DC

## 2017-05-23 MED ORDER — AMLODIPINE BESYLATE 10 MG PO TABS: ORAL_TABLET | ORAL | 0 refills | Status: DC

## 2017-05-23 NOTE — Telephone Encounter (Signed)
LVM for Regina Burns to call the office. If Regina Burns calls, please schedule her an appointment to meet with Dr. Burr Medico for BP med refills and lab work. Ottis Stain, CMA

## 2017-05-23 NOTE — Telephone Encounter (Signed)
Will refill patient's BP meds x1 month however she does need a visit to check lab work, it looks like we have not seen her in over a year for this.  Thank you

## 2017-05-23 NOTE — Telephone Encounter (Signed)
Pt made an appt for med refills 06-10-17.  She only has 3 more pills of her amlodipine left. Could she get enough to get her to the appt on may 24. She uses [facility] on [address]

## 2017-05-23 NOTE — Telephone Encounter (Signed)
norvasc sent to her pharmacy

## 2017-05-24 MED ORDER — ATENOLOL-CHLORTHALIDONE 100-25 MG PO TABS: 1.0000 | ORAL_TABLET | Freq: Every day | ORAL | 0 refills | Status: DC

## 2017-05-24 NOTE — Telephone Encounter (Signed)
PT called back and left messge that she is out of atenolol and needs that refilled to last until her appt as well. Wallace Cullens, RN

## 2017-06-10 ENCOUNTER — Other Ambulatory Visit: Payer: Self-pay

## 2017-06-10 ENCOUNTER — Encounter: Payer: Self-pay | Admitting: Student in an Organized Health Care Education/Training Program

## 2017-06-10 ENCOUNTER — Ambulatory Visit (INDEPENDENT_AMBULATORY_CARE_PROVIDER_SITE_OTHER): Payer: BC Managed Care – PPO | Admitting: Student in an Organized Health Care Education/Training Program

## 2017-06-10 ENCOUNTER — Ambulatory Visit: Payer: BC Managed Care – PPO | Admitting: Student in an Organized Health Care Education/Training Program

## 2017-06-10 DIAGNOSIS — R609 Edema, unspecified: Secondary | ICD-10-CM

## 2017-06-10 DIAGNOSIS — I10 Essential (primary) hypertension: Secondary | ICD-10-CM

## 2017-06-10 NOTE — Progress Notes (Signed)
Subjective:    Regina Burns - 62 y.o. female MRN [id number]  Date of birth: [birth year]  HPI  Regina Burns is here for blood pressure follow up and med refill.  She reports that she recently lost 23 pounds with prediabetes weight loss program. She has been walking for exercise. She has not been checking her BP at home but she did already order a blood pressure cuff and she plans to start checking. She denies chest pain, palpitations, dyspnea, light headedness or syncope. She denies any problems taking her atenolol-chlorthalidone (Tenoretic) 100-25 mg daily. She also has been taking amlodipine 10 mg daily but notes that she has had bilateral LE edema (wearing compression stockings today.) She is open to a trial off of amlodipine to see if this improves leg swelling.   Last Bmet  Potassium  Date Value Ref Range Status  03/18/2016 3.4 (L) 3.5 - 5.3 mmol/L Final   Sodium  Date Value Ref Range Status  03/18/2016 141 135 - 146 mmol/L Final   Creat  Date Value Ref Range Status  03/18/2016 1.00 (H) 0.50 - 0.99 mg/dL Final    Comment:      For patients > or = 62 years of age: The upper reference limit for Creatinine is approximately 13% higher for people identified as African-American.         Last BPs:  BP Readings from Last 3 Encounters:  06/10/17 132/80  08/12/16 134/74  07/01/16 130/68    Health Maintenance:  Health Maintenance Due  Topic Date Due  . COLONOSCOPY  07/10/2005    -  reports that she quit smoking about 19 years ago. She has never used smokeless tobacco. - Review of Systems: Per HPI. - Past Medical History: Patient Active Problem List   Diagnosis Date Noted  . Restless leg syndrome 06/08/2014  . Sleep apnea 12/31/2009  . LEG EDEMA, BILATERAL 12/31/2009  . HYPERTENSION, BENIGN ESSENTIAL 08/09/2006  . OBESITY, NOS 03/17/2006  . GASTROESOPHAGEAL REFLUX, NO ESOPHAGITIS 03/17/2006   - Medications: reviewed and updated Current  Outpatient Medications  Medication Sig Dispense Refill  . atenolol-chlorthalidone (TENORETIC) 100-25 MG tablet Take 1 tablet by mouth daily. 90 tablet 0  . gabapentin (NEURONTIN) 100 MG capsule Take 3 capsules (300 mg total) by mouth at bedtime. 90 capsule 0  . lovastatin (MEVACOR) 40 MG tablet TAKE 1 TABLET BY MOUTH AT BEDTIME 90 tablet 0  . trolamine salicylate (ASPERCREME/ALOE) 10 % cream Apply 1 application topically as needed for muscle pain. 85 g 0   No current facility-administered medications for this visit.     Review of Systems See HPI     Objective:   Physical Exam BP 132/80 (BP Location: Right Arm)   Pulse 61   Temp 98.1 F (36.7 C) (Oral)   Wt (!) 327 lb 3.2 oz (148.4 kg)   BMI 50.12 kg/m  Gen: NAD, alert, cooperative with exam, well-appearing  CV: RRR, good S1/S2, no murmur, capillary refill brisk  Resp: CTABL Abd: SNTND EXT:  +compression hose in place, no edema under hose, no calf tenderness Neuro: no gross deficits.  Psych: good insight, alert and oriented     Assessment & Plan:   LEG EDEMA, BILATERAL No calf tenderness or evidence of cellulitis or blood clot. She does have compression hose on today. We will trial off amlodipine as noted in HTN plan. Continue compression, elevation, low salt diet.  HYPERTENSION, BENIGN ESSENTIAL Blood pressure is well controlled in the office today. No  red flags. We will trial off of norvasc to see if this improves leg swelling. Patient to follow up in 1-2 weeks for BP check as nurse visit. Continue atenolol-chlorthalidone at current dose. Continue to encourage weight loss efforts.  Everrett Coombe, MD,MS,  PGY2 06/11/2017 4:42 PM

## 2017-06-10 NOTE — Patient Instructions (Addendum)
It was a pleasure seeing you today in our clinic.  Here is the treatment plan we have discussed and agreed upon together:  STOP your norvasc. We will see if this improves your swelling.  Congratulations on your weight loss!  Please schedule a NURSE visit follow up to have your blood pressure checked in 1-2 weeks.   Our clinic's number is [phone]. Please call with questions or concerns about what we discussed today.  Be well, Dr. Burr Medico

## 2017-06-11 NOTE — Assessment & Plan Note (Addendum)
No calf tenderness or evidence of cellulitis or blood clot. She does have compression hose on today. We will trial off amlodipine as noted in HTN plan. Continue compression, elevation, low salt diet.

## 2017-06-11 NOTE — Assessment & Plan Note (Signed)
Blood pressure is well controlled in the office today. No red flags. We will trial off of norvasc to see if this improves leg swelling. Patient to follow up in 1-2 weeks for BP check as nurse visit. Continue atenolol-chlorthalidone at current dose. Continue to encourage weight loss efforts.

## 2017-06-16 ENCOUNTER — Other Ambulatory Visit: Payer: Self-pay | Admitting: Student in an Organized Health Care Education/Training Program

## 2017-06-16 ENCOUNTER — Other Ambulatory Visit: Payer: Self-pay

## 2017-06-17 MED ORDER — GABAPENTIN 100 MG PO CAPS: 300.0000 mg | ORAL_CAPSULE | Freq: Every day | ORAL | 0 refills | Status: DC

## 2017-06-24 ENCOUNTER — Ambulatory Visit: Payer: BC Managed Care – PPO

## 2017-06-24 VITALS — BP 130/60

## 2017-06-24 DIAGNOSIS — I10 Essential (primary) hypertension: Secondary | ICD-10-CM

## 2017-06-24 NOTE — Progress Notes (Signed)
Pt came in for Blood Pressure check. Bp was 130/60. Ottis Stain, CMA

## 2017-07-04 ENCOUNTER — Ambulatory Visit: Payer: BC Managed Care – PPO | Admitting: Family Medicine

## 2017-07-13 ENCOUNTER — Other Ambulatory Visit: Payer: Self-pay | Admitting: Family Medicine

## 2017-07-18 ENCOUNTER — Other Ambulatory Visit: Payer: Self-pay

## 2017-07-18 ENCOUNTER — Encounter: Payer: Self-pay | Admitting: Family Medicine

## 2017-07-18 ENCOUNTER — Ambulatory Visit (INDEPENDENT_AMBULATORY_CARE_PROVIDER_SITE_OTHER): Payer: BC Managed Care – PPO | Admitting: Family Medicine

## 2017-07-18 VITALS — BP 134/72 | HR 67 | Temp 98.3°F | Ht 68.0 in | Wt 326.2 lb

## 2017-07-18 DIAGNOSIS — R0683 Snoring: Secondary | ICD-10-CM

## 2017-07-18 DIAGNOSIS — Z1211 Encounter for screening for malignant neoplasm of colon: Secondary | ICD-10-CM | POA: Diagnosis not present

## 2017-07-18 DIAGNOSIS — I10 Essential (primary) hypertension: Secondary | ICD-10-CM | POA: Diagnosis not present

## 2017-07-18 NOTE — Progress Notes (Signed)
   CC: sleep study  HPI  Snoring - works as a Teacher, early years/pre. States work needs sleep study because of her weight. She does snore. No one sleeps in her room, but her son has heard her snoring. He thinks he heard apnea. No hx of hard to control HTN. PSG ordered back in 2017, patient never got it.   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   ROS: Denies CP, SOB, abdominal pain, dysuria, changes in BMs.   CC, SH/smoking status, and VS noted  Objective: BP 134/72   Pulse 67   Temp 98.3 F (36.8 C) (Oral)   Ht '5\' 8"'$  (1.727 m)   Wt (!) 326 lb 3.2 oz (148 kg)   SpO2 97%   BMI 49.60 kg/m  Gen: NAD, alert, cooperative, and pleasant. HEENT: NCAT, EOMI, PERRL CV: RRR, no murmur Resp: CTAB, no wheezes, non-labored Ext: No edema, warm Neuro: Alert and oriented, Speech clear, No gross deficits  Assessment and plan:  Snoring Concern for OSA based on hx, will get PSG.   HYPERTENSION, BENIGN ESSENTIAL BP controlled, will repeat labs today for electrolytes and K. Continue tenoretic.   Did not provide handicap placard as patient does not have deficits and can walk this distance, counseled her that this is likely good exercise for her, she agrees.  Orders Placed This Encounter  Procedures  . Fecal occult blood, imunochemical([facility])  . CBC  . Basic metabolic panel  . PSG Sleep Study    Standing Status:   Future    Standing Expiration Date:   07/19/2018    Order Specific Question:   Where should this test be performed:    Answer:   [place]    HM - she does not want to do colonoscopy, provided kit for home FOBT.   Ralene Ok, MD, PGY3 07/19/2017 11:07 AM

## 2017-07-18 NOTE — Patient Instructions (Signed)
It was a pleasure to see you today! Thank you for choosing [facility] for your primary care. Regina Burns was seen for snoring.   Our plans for today were:  The sleep center will call you.   I will call you if your labs are abnormal, if you don't hear from me assume they are fine.   Use the home kit to check for blood in your stool.   Best,  Dr. Lindell Noe

## 2017-07-19 DIAGNOSIS — R0683 Snoring: Secondary | ICD-10-CM | POA: Insufficient documentation

## 2017-07-19 LAB — BASIC METABOLIC PANEL
BUN/Creatinine Ratio: 15 (ref 12–28)
BUN: 16 mg/dL (ref 8–27)
CALCIUM: 9.6 mg/dL (ref 8.7–10.3)
CO2: 30 mmol/L — AB (ref 20–29)
CREATININE: 1.06 mg/dL — AB (ref 0.57–1.00)
Chloride: 101 mmol/L (ref 96–106)
GFR calc Af Amer: 65 mL/min/{1.73_m2} (ref >59)
GFR calc non Af Amer: 56 mL/min/{1.73_m2} — ABNORMAL LOW (ref >59)
Glucose: 97 mg/dL (ref 65–99)
POTASSIUM: 4 mmol/L (ref 3.5–5.2)
Sodium: 143 mmol/L (ref 134–144)

## 2017-07-19 LAB — CBC
HEMOGLOBIN: 12.9 g/dL (ref 11.1–15.9)
Hematocrit: 38.7 % (ref 34.0–46.6)
MCH: 26 pg — ABNORMAL LOW (ref 26.6–33.0)
MCHC: 33.3 g/dL (ref 31.5–35.7)
MCV: 78 fL — ABNORMAL LOW (ref 79–97)
Platelets: 322 10*3/uL (ref 150–450)
RBC: 4.97 x10E6/uL (ref 3.77–5.28)
RDW: 15.8 % — ABNORMAL HIGH (ref 12.3–15.4)
WBC: 5.8 10*3/uL (ref 3.4–10.8)

## 2017-07-19 NOTE — Assessment & Plan Note (Signed)
BP controlled, will repeat labs today for electrolytes and K. Continue tenoretic.

## 2017-07-19 NOTE — Assessment & Plan Note (Signed)
Concern for OSA based on hx, will get PSG.

## 2017-08-09 ENCOUNTER — Other Ambulatory Visit: Payer: Self-pay | Admitting: Family Medicine

## 2017-08-18 ENCOUNTER — Ambulatory Visit (HOSPITAL_BASED_OUTPATIENT_CLINIC_OR_DEPARTMENT_OTHER): Payer: BC Managed Care – PPO | Attending: Family Medicine | Admitting: Internal Medicine

## 2017-08-18 VITALS — Ht 68.0 in | Wt 326.0 lb

## 2017-08-18 DIAGNOSIS — G4733 Obstructive sleep apnea (adult) (pediatric): Secondary | ICD-10-CM | POA: Diagnosis not present

## 2017-08-18 DIAGNOSIS — R0683 Snoring: Secondary | ICD-10-CM

## 2017-08-18 DIAGNOSIS — G4736 Sleep related hypoventilation in conditions classified elsewhere: Secondary | ICD-10-CM | POA: Diagnosis not present

## 2017-09-03 ENCOUNTER — Other Ambulatory Visit: Payer: Self-pay | Admitting: Student in an Organized Health Care Education/Training Program

## 2017-09-05 ENCOUNTER — Telehealth: Payer: Self-pay

## 2017-09-05 NOTE — Telephone Encounter (Signed)
Pt calling for sleep study results. Pt call back [phone] Wallace Cullens, RN

## 2017-09-07 NOTE — Telephone Encounter (Signed)
Pt called again about sleep study results.  Please call her back

## 2017-09-07 NOTE — Telephone Encounter (Signed)
Patient calling again for results of sleep study that she needs for her job.  Also requests a note be written that she can park closer to the building due to a problem with her feet. 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  Call back is [phone]  Danley Danker, RN [facility])

## 2017-09-08 DIAGNOSIS — R0683 Snoring: Secondary | ICD-10-CM | POA: Diagnosis not present

## 2017-09-08 NOTE — Telephone Encounter (Signed)
Her sleep study showed moderate sleep apnea. We should have her come back in for a follow up visit to discuss these results, sleep hygiene and how to get her a CPAP machine.

## 2017-09-08 NOTE — Telephone Encounter (Signed)
Pt informed and appt made for 09/29/17. Fleeger, [name], CMA

## 2017-09-08 NOTE — Procedures (Signed)
    Patient Name: Regina Burns, Regina Burns Date: 08/18/2017 Gender: Female D.O.B: 1955/02/05 Age (years): 62 Referring Provider: Madison Hickman Height (inches): 10 Interpreting Physician: Baird Lyons MD, ABSM Weight (lbs): 326 RPSGT: Lanae Boast BMI: 51 MRN: [id number] Neck Size: 14.00  CLINICAL INFORMATION Sleep Study Type: NPSG  Indication for sleep study: Snoring  Epworth Sleepiness Score:   1  SLEEP STUDY TECHNIQUE As per the AASM Manual for the Scoring of Sleep and Associated Events v2.3 (April 2016) with a hypopnea requiring 4% desaturations.  The channels recorded and monitored were frontal, central and occipital EEG, electrooculogram (EOG), submentalis EMG (chin), nasal and oral airflow, thoracic and abdominal wall motion, anterior tibialis EMG, snore microphone, electrocardiogram, and pulse oximetry.  MEDICATIONS Medications self-administered by patient taken the night of the study : none reported  SLEEP ARCHITECTURE The study was initiated at 11:52:03 PM and ended at 5:53:00 AM.  Sleep onset time was 4.9 minutes and the sleep efficiency was 85.2%%. The total sleep time was 307.5 minutes.  Stage REM latency was 207.5 minutes.  The patient spent 5.9%% of the night in stage N1 sleep, 88.5%% in stage N2 sleep, 0.2%% in stage N3 and 5.5% in REM.  Alpha intrusion was absent.  Supine sleep was 27.32%.  RESPIRATORY PARAMETERS The overall apnea/hypopnea index (AHI) was 22.4 per hour. There were 22 total apneas, including 22 obstructive, 0 central and 0 mixed apneas. There were 93 hypopneas and 2 RERAs.  The AHI during Stage REM sleep was 81.2 per hour.  AHI while supine was 39.3 per hour.  The mean oxygen saturation was 88.6%. The minimum SpO2 during sleep was 71.0%.  loud snoring was noted during this study.  CARDIAC DATA The 2 lead EKG demonstrated sinus rhythm. The mean heart rate was 59.7 beats per minute. Other EKG findings include:  PVCs.  LEG MOVEMENT DATA The total PLMS were 0 with a resulting PLMS index of 0.0. Associated arousal with leg movement index was 0.0 .  IMPRESSIONS - Moderate obstructive sleep apnea occurred during this study (AHI = 22.4/h). - No significant central sleep apnea occurred during this study (CAI = 0.0/h). - Moderate oxygen desaturation was noted during this study (Min O2 = 71.0%). - The patient snored with loud snoring volume. - EKG findings include PVCs. - Clinically significant periodic limb movements did not occur during sleep. No significant associated arousals  DIAGNOSIS - Obstructive Sleep Apnea (327.23 [G47.33 ICD-10]) - Nocturnal Hypoxemia (327.26 [G47.36 ICD-10])  RECOMMENDATIONS - Suggest CPAP titration sleep study or DME autopap. Other options would be based on clinical judgment. - Be careful with alcohol, sedatives and other CNS depressants that may worsen sleep apnea and disrupt normal sleep architecture. - Sleep hygiene should be reviewed to assess factors that may improve sleep quality. - Weight management and regular exercise should be initiated or continued if appropriate.  [Electronically signed] 09/08/2017 10:44 AM  Baird Lyons MD, ABSM Diplomate, American Board of Sleep Medicine   NPI: [id number]                         [place], [place] [facility]  ELECTRONICALLY SIGNED ON:  09/08/2017, 10:42 AM [place] PH: [phone]   FX: [phone] [place]

## 2017-09-09 ENCOUNTER — Telehealth: Payer: Self-pay

## 2017-09-09 NOTE — Telephone Encounter (Signed)
-----   Message from Sela Hilding, MD sent at 09/09/2017 10:43 AM EDT ----- Dr. Doyce Loose that patient needs appt to discuss this. Did we make an appt?

## 2017-09-09 NOTE — Telephone Encounter (Signed)
Yes, Appt has been made. Ottis Stain, CMA

## 2017-09-11 ENCOUNTER — Other Ambulatory Visit: Payer: Self-pay | Admitting: Student in an Organized Health Care Education/Training Program

## 2017-09-11 ENCOUNTER — Other Ambulatory Visit: Payer: Self-pay | Admitting: Family Medicine

## 2017-09-29 ENCOUNTER — Encounter: Payer: Self-pay | Admitting: Student in an Organized Health Care Education/Training Program

## 2017-09-29 ENCOUNTER — Ambulatory Visit (INDEPENDENT_AMBULATORY_CARE_PROVIDER_SITE_OTHER): Payer: BC Managed Care – PPO | Admitting: Student in an Organized Health Care Education/Training Program

## 2017-09-29 ENCOUNTER — Other Ambulatory Visit: Payer: Self-pay

## 2017-09-29 VITALS — BP 150/71 | HR 68 | Temp 97.8°F | Wt 338.0 lb

## 2017-09-29 DIAGNOSIS — Z23 Encounter for immunization: Secondary | ICD-10-CM | POA: Diagnosis not present

## 2017-09-29 DIAGNOSIS — G4733 Obstructive sleep apnea (adult) (pediatric): Secondary | ICD-10-CM | POA: Diagnosis not present

## 2017-09-29 NOTE — Progress Notes (Signed)
   CC: OSA and insomnia  HPI: Regina Burns is a 62 y.o. female  who presents to [facility] today for follow up after a sleep study.   Insomnia/OSA Patient recently underwent sleep study and was found to have obstructive sleep apnea.  She does endorse feelings of insomnia. Her typical sleep habits include going to bed at 9-10 PM, however she has difficulty falling asleep until 1-2AM. She sleeps in her bed in her bedroom. She reports that she watches TV and falls in and out of sleep from 10PM until 2AM. She then has to wake up for work at 3:45AM.  She does not take naps She does not drink alcohol She drinks 10-20oz of coffee in the morning, no other caffeine throughout the day  Positive screen for depression Patient denies suicidal or homicidal ideation. She feels this is important but not the most important reason she came in today and would like to schedule follow up to discuss depressed mood further.  HTN We previously discontinued her amlodipine for LE edema, and her edema improved. She continues to take atenolol-chlorthalidone daily. She denies chest pain, dyspnea, headaches, or blurry vision. Her BP is elevated today at 150/71.    Office Visit from 09/29/2017 in [place]  PHQ-9 Total Score  17       Review of Symptoms:  See HPI for ROS.   CC, SH/smoking status, and VS noted.  Objective: BP (!) 150/71   Pulse 68   Temp 97.8 F (36.6 C) (Oral)   Wt (!) 338 lb (153.3 kg)   SpO2 98%   BMI 51.39 kg/m  GEN: NAD, alert, cooperative, and pleasant. RESPIRATORY: comfortable work of breathing, speaks in full sentences CV: Regular rate noted GI: soft, nondistended SKIN: warm and dry, no rashes or lesions NEURO: II-XII grossly intact PSYCH: AAOx3, appropriate affect, thought process linear  Assessment and plan: OSA - discussed sleep hygiene today at length, made recommendations to limit sedating medications (melatonin is fine) - referral to pulm for  CPAP titration  HTN - will follow up after she starts CPAP and consider addition of another antihypertensive agent - no red flags, continue current management for now  Positive PHQ9 - patient to schedule appointment for dedicated mood visit with me as soon as possible. She is not considered a danger to herself or others based on my assessment today  Orders Placed This Encounter  Procedures  . Flu Vaccine QUAD 36+ mos IM  . Ambulatory referral to Pulmonology    Referral Priority:   Routine    Referral Type:   Consultation    Referral Reason:   Specialty Services Required    Requested Specialty:   Pulmonary Disease    Number of Visits Requested:   1   Everrett Coombe, MD,MS,  PGY3 09/29/2017 12:01 PM

## 2017-09-29 NOTE — Patient Instructions (Signed)
It was a pleasure seeing you today in our clinic. Here is the treatment plan we have discussed and agreed upon together:  A consult was placed to pulmonology at today's visit.  You will receive a call to schedule an appointment. If you do not receive a call within two weeks please call our office so we can place the consult again.  Our clinic's number is [phone]. Please call with questions or concerns about what we discussed today.  Be well, Dr. Burr Medico  .

## 2017-09-30 ENCOUNTER — Telehealth: Payer: Self-pay

## 2017-09-30 NOTE — Telephone Encounter (Signed)
Patient left voicemail that she needs a note from PCP for work that she is in compliance with having a sleep study performed and is now just waiting for her CPAP.  She also stated that she needs to know about picking her CPAP up?  Call back is [phone]  Danley Danker, RN [facility])

## 2017-10-01 ENCOUNTER — Encounter: Payer: Self-pay | Admitting: Student in an Organized Health Care Education/Training Program

## 2017-10-01 NOTE — Telephone Encounter (Signed)
The patient was referred to pulmonology for CPAP titration. She was not given a script for CPAP by our office. I will send a letter to the patient stating that she had a sleep study completed for work.

## 2017-10-04 NOTE — Telephone Encounter (Signed)
Contacted [facility] to inquire if we needed to set anything up for this pt and she stated that they do have it in their work que and that they would be contacting the pt to set up an appointment.Katharina Caper, April D, [place]

## 2017-10-05 ENCOUNTER — Telehealth: Payer: Self-pay | Admitting: Student in an Organized Health Care Education/Training Program

## 2017-10-05 NOTE — Telephone Encounter (Signed)
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  If any questions call patient at [phone].

## 2017-10-05 NOTE — Telephone Encounter (Signed)
Please let patient know I will leave the sleep study document in an envelope for her at the front desk.

## 2017-10-06 ENCOUNTER — Other Ambulatory Visit: Payer: Self-pay | Admitting: Student in an Organized Health Care Education/Training Program

## 2017-10-06 NOTE — Telephone Encounter (Signed)
She still needs to get her Cpap machine and needs it asap because she cannot drive for work if she does not have it.  Best number to call her is [phone]

## 2017-10-06 NOTE — Telephone Encounter (Signed)
Pt informed. Sharon T Saunders, CMA  

## 2017-10-06 NOTE — Telephone Encounter (Signed)
Spoke to pt. Informed her that [facility] will be giving her a call to scheduled an appt. I also gave her [place]"s phone number if she wants to call. Pt also informed that the paperwork from the sleep study is up front for her to pick up.

## 2017-10-11 ENCOUNTER — Other Ambulatory Visit: Payer: Self-pay | Admitting: Student in an Organized Health Care Education/Training Program

## 2017-10-26 ENCOUNTER — Ambulatory Visit (INDEPENDENT_AMBULATORY_CARE_PROVIDER_SITE_OTHER): Payer: BC Managed Care – PPO | Admitting: Pulmonary Disease

## 2017-10-26 ENCOUNTER — Encounter: Payer: Self-pay | Admitting: Pulmonary Disease

## 2017-10-26 VITALS — BP 138/78 | HR 72 | Ht 67.5 in | Wt 334.0 lb

## 2017-10-26 DIAGNOSIS — G4730 Sleep apnea, unspecified: Secondary | ICD-10-CM | POA: Diagnosis not present

## 2017-10-26 NOTE — Patient Instructions (Signed)
Recent sleep study did reveal moderate obstructive sleep apnea, moderately severe oxygen desaturations  We will schedule you for a titration study  I will see you back in about 1 to 2 months following initiation of treatment, will review the download from the machine to ascertain optimal treatment  Call with any questions/concerns

## 2017-10-26 NOTE — Progress Notes (Signed)
Subjective:    Patient ID: Regina Burns, female    DOB: 12-Oct-1955, 62 y.o.   MRN: [id number] Chief complaint: Recent sleep study revealing moderate obstructive sleep apnea with moderate oxygen desaturation  HPI History of snoring, diagnosed with sleep apnea and a recent sleep study Usually goes to bed about 10-12 AM Wakes up about 3-4 times during the night Takes about an hour to fall asleep Wakes up about 3:45 AM  She is usually not sleepy during the day She does complain of a dry mouth memory is good, ability to concentrate is a little bit off Denies any headaches No history of heart disease Reformed smoker-quit in October 2000  Past Medical History:  Diagnosis Date  . GERD (gastroesophageal reflux disease) 2005  . Hyperlipidemia 2012  . Hypertension 2012   Social History   Tobacco Use  . Smoking status: Former Smoker    Last attempt to quit: 01/18/1998    Years since quitting: 19.7  . Smokeless tobacco: Never Used  Substance Use Topics  . Alcohol use: No  . Drug use: No    Frequency: 3.0 times per week   Family History  Problem Relation Age of Onset  . Alcohol abuse Mother   . Alzheimer's disease Mother   . Heart disease Mother   . Diabetes Mother   . Hypertension Mother   . Stroke Mother   . Heart disease Father   . Diabetes Father   . Hyperlipidemia Father   . Hypertension Father   . Alzheimer's disease Maternal Aunt   . Hypertension Paternal Aunt   . Hypertension Paternal Uncle   . Hypertension Maternal Grandmother   . Breast cancer Neg Hx    Review of Systems  Constitutional: Negative.   HENT: Negative.   Eyes: Negative.   Respiratory: Negative.   Cardiovascular: Negative.   Gastrointestinal: Negative.   Endocrine: Negative.   Genitourinary: Negative.   Musculoskeletal: Negative.   Allergic/Immunologic: Negative.   Neurological: Negative.   Hematological: Negative.   Psychiatric/Behavioral: Positive for sleep disturbance.    Vitals:   10/26/17 1500  BP: 138/78  Pulse: 72  SpO2: 96%      Objective:   Physical Exam  Constitutional: She is oriented to person, place, and time. She appears well-developed and well-nourished. No distress.  HENT:  Head: Normocephalic and atraumatic.  Eyes: Pupils are equal, round, and reactive to light. EOM are normal. Right eye exhibits no discharge. Left eye exhibits no discharge.  Neck: Neck supple. No tracheal deviation present.  Cardiovascular: Normal rate and regular rhythm. Exam reveals no friction rub.  No murmur heard. Pulmonary/Chest: Effort normal and breath sounds normal. No stridor. No respiratory distress. She has no wheezes. She has no rales.  Abdominal: Bowel sounds are normal. There is no tenderness.  Musculoskeletal: Normal range of motion. She exhibits edema. She exhibits no deformity.  Neurological: She is alert and oriented to person, place, and time. No cranial nerve deficit. Coordination normal.  Skin: Skin is warm. She is not diaphoretic.    Sleep study reviewed by myself showing moderate obstructive sleep apnea with moderately severe oxygen desaturations     Assessment & Plan:  .  Moderately severe obstructive sleep apnea with moderately severe oxygen desaturations  .  She is not very sleepy during the day  .  Reformed smoker  Plan. Schedule the patient for an in lab titration study for moderate obstructive sleep apnea with moderately severe oxygen desaturations  Pathophysiology of sleep disordered breathing  discussed Treatment options for sleep disordered breathing discussed Importance of weight loss and regular exercises discussed  I will see her in the office about 1 to 2 months following initiation of treatment

## 2017-11-02 ENCOUNTER — Ambulatory Visit: Payer: BC Managed Care – PPO | Admitting: Licensed Clinical Social Worker

## 2017-11-02 ENCOUNTER — Other Ambulatory Visit: Payer: Self-pay

## 2017-11-02 ENCOUNTER — Ambulatory Visit (INDEPENDENT_AMBULATORY_CARE_PROVIDER_SITE_OTHER): Payer: BC Managed Care – PPO | Admitting: Student in an Organized Health Care Education/Training Program

## 2017-11-02 ENCOUNTER — Encounter: Payer: Self-pay | Admitting: Student in an Organized Health Care Education/Training Program

## 2017-11-02 VITALS — BP 128/66 | HR 61 | Temp 98.7°F | Ht 68.0 in | Wt 335.6 lb

## 2017-11-02 DIAGNOSIS — F329 Major depressive disorder, single episode, unspecified: Secondary | ICD-10-CM | POA: Diagnosis not present

## 2017-11-02 DIAGNOSIS — R4589 Other symptoms and signs involving emotional state: Secondary | ICD-10-CM

## 2017-11-02 MED ORDER — FLUOXETINE HCL 20 MG PO TABS: 20.0000 mg | ORAL_TABLET | Freq: Every day | ORAL | 3 refills | Status: DC

## 2017-11-02 NOTE — BH Specialist Note (Signed)
Integrated Behavioral Health Warm Handoff  MRN: [id number] Name: Regina Burns  Number of [facility] Clinician visits::  Session Start time: 10:00  Session End time: 10:20 Total time: 20 minutes  Type of Service: Walker Interpretor:Yes.   Interpretor Name and Language: NA   Warm Hand Off Completed.     Patient  verbally consented to meet with [facility] Consultant about presenting concerns. SUBJECTIVE: Regina Burns is a 62 y.o. female referred by Dr. Burr Medico for assistance with managing symptoms of depression.  Patient also reports difficulty with managing stressors Mental Health History/ Substance use: depression; substance not assessed OBJECTIVE: Mood: with in normal limits and Affect: Appropriate. Risk of harm to self or others: not identified LIFE CONTEXT: Family and Social: lives with son and grandson School/Work: drives a school bus Self-Care: starting to walk daily and eat healthier Life Changes: will start new job at [employer], grandson lives with her full time GOALS ADDRESSED: Patient will: 1. Reduce symptoms of: anxiety, depression and stress 2. Increase knowledge and/or ability of: coping skills, self-management skills and stress reduction  3. Demonstrate ability to: Increase healthy adjustment to current life circumstances 4.INTERVENTIONS: Interventions utilized: Behavioral Activation and Supportive Counseling, Psychoeducation and relaxed breathing Issues discussed: support system, previous and current coping skills, managing stress ,schedule for walking and relaxed breathing. ASSESSMENT: Patient is currently experiencing symptoms of depression which are exacerbated by stressors and life transistions.  Patient may benefit from and is in agreement implement brief therapeutic interventions discussed today to assist with managing her symptoms.  PLAN: 1. Follow up with behavioral health clinician: as  needed 2. Behavioral recommendations: relaxed breathing 3 times daily, walking daily 3. Referral(s): none at this time 4. [facility] F/U call to patient in 5 to 7 days to see if she started medication.  Casimer Lanius, [facility]   [phone] 11:22 AM

## 2017-11-02 NOTE — Patient Instructions (Addendum)
It was a pleasure seeing you today in our clinic. Today we discussed your mood.  Our clinic's number is [phone]. Please call with questions or concerns about what we discussed today.  Be well, Dr. Burr Medico  Sign up for My Chart to have easy access to your labs results, and communication with your primary care physician.    10 LITTLE Things To Do When You're Feeling Too Down To Do Anything  Take a shower. Even if you plan to stay in all day long and not see a soul, take a shower. It takes the most effort to hop in to the shower but once you do, you'll feel immediate results. It will wake you up and you'll be feeling much fresher (and cleaner too).  Brush and floss your teeth. Give your teeth a good brushing with a floss finish. It's a small task but it feels so good and you can check 'taking care of your health' off the list of things to do.  Do something small on your list. Most of [place] have some small thing we would like to get done (load of laundry, sew a button, email a friend). Doing one of these things will make you feel like you've accomplished something.  Drink water. Drinking water is easy right? It's also really beneficial for your health so keep a glass beside you all day and take sips often. It gives you energy and prevents you from boredom eating.  Do some floor exercises. The last thing you want to do is exercise but it might be just the thing you need the most. Keep it simple and do exercises that involve sitting or laying on the floor. Even the smallest of exercises release chemicals in the brain that make you feel good. Yoga stretches or core exercises are going to make you feel good with minimal effort.  Make your bed. Making your bed takes a few minutes but it's productive and you'll feel relieved when it's done. An unmade bed is a huge visual reminder that you're having an unproductive day. Do it and consider it your housework for the day.  Put on some nice  clothes. Take the sweatpants off even if you don't plan to go anywhere. Put on clothes that make you feel good. Take a look in the mirror so your brain recognizes the sweatpants have been replaced with clothes that make you look great. It's an instant confidence booster.  Wash the dishes. A pile of dirty dishes in the sink is a reflection of your mood. It's possible that if you wash up the dishes, your mood will follow suit. It's worth a try.  Cook a real meal. If you have the luxury to have a "do nothing" day, you have time to make a real meal for yourself. Make a meal that you love to eat. The process is good to get you out of the funk and the food will ensure you have more energy for tomorrow.  Write out your thoughts by hand. When you hand write, you stimulate your brain to focus on the moment that you're in so make yourself comfortable and write whatever comes into your mind. Put those thoughts out on paper so they stop spinning around in your head. Those thoughts might be the very thing holding you down.

## 2017-11-02 NOTE — Progress Notes (Addendum)
Subjective:    Regina Burns - 62 y.o. female MRN [id number]  Date of birth: [birth year]  HPI  Regina Burns is here for depression symptoms.  Depressed mood Patient reports that she has had depressed mood symptoms intermittently over the past two years. With this episode, she reports she has had difficulty sleeping, difficulty concentrating, and she has had increased appetite. She has had difficulty getting up and moving to get things done in her daily life. She cites new life stressors including caring for her 44 year old grandson who is not allowed to see his mother, and she has been the mother figure for him. She has not had any suicidal or homicidal ideation. She reports she has tried to help her mood by starting a walking regimen. Additionally she got a new job and hopes that this will also improve her mood. She is interested in [facility] services but also asks if we can start a medicine today to help with her mood symptoms.   No family history of psych disorders No hx psych disorders  Has been prescribed zoloft in the past however she reports she never took it because it made her nervous. She states she looked things up and learned too much about potential side effects from this medicine. She would like a different medicine prescribed.    Depression screen [facility] 2/9 11/02/2017 09/29/2017 09/29/2017  Decreased Interest 1 2 2   Down, Depressed, Hopeless 2 2 2   PHQ - 2 Score 3 4 4   Altered sleeping 2 3 -  Tired, decreased energy 2 3 -  Change in appetite 2 3 -  Feeling bad or failure about yourself  1 1 -  Trouble concentrating 2 3 -  Moving slowly or fidgety/restless 0 0 -  Suicidal thoughts 0 0 -  PHQ-9 Score 12 17 -  Difficult doing work/chores Somewhat difficult Somewhat difficult -    Health Maintenance:  Health Maintenance Due  Topic Date Due  . COLONOSCOPY  07/10/2005    -  reports that she quit smoking about 19 years ago. She has never used smokeless  tobacco. - Review of Systems: Per HPI. - Past Medical History: Patient Active Problem List   Diagnosis Date Noted  . Snoring 07/19/2017  . Restless leg syndrome 06/08/2014  . Sleep apnea 12/31/2009  . LEG EDEMA, BILATERAL 12/31/2009  . HYPERTENSION, BENIGN ESSENTIAL 08/09/2006  . OBESITY, NOS 03/17/2006  . GASTROESOPHAGEAL REFLUX, NO ESOPHAGITIS 03/17/2006   - Medications: reviewed and updated Current Outpatient Medications  Medication Sig Dispense Refill  . atenolol-chlorthalidone (TENORETIC) 100-25 MG tablet Take 1 tablet by mouth daily. 90 tablet 0  . FLUoxetine (PROZAC) 20 MG tablet Take 1 tablet (20 mg total) by mouth daily. 30 tablet 3  . gabapentin (NEURONTIN) 300 MG capsule TAKE 1 CAPSULE(300 MG) BY MOUTH AT BEDTIME 30 capsule 0  . lovastatin (MEVACOR) 40 MG tablet TAKE 1 TABLET BY MOUTH AT BEDTIME 90 tablet 3  . trolamine salicylate (ASPERCREME/ALOE) 10 % cream Apply 1 application topically as needed for muscle pain. 85 g 0   No current facility-administered medications for this visit.     Review of Systems See HPI     Objective:   Physical Exam BP 128/66   Pulse 61   Temp 98.7 F (37.1 C) (Oral)   Ht 5\' 8"  (1.727 m)   Wt (!) 335 lb 9.6 oz (152.2 kg)   SpO2 97%   BMI 51.03 kg/m  GEN: NAD, alert, cooperative, and  pleasant. RESPIRATORY: Comfortable work of breathing, speaks in full sentences CV: Regular rate noted, distal extremities well perfused and warm without edema GI: Soft, nondistended SKIN: warm and dry, no rashes or lesions NEURO: II-XII grossly intact MSK: Moves 4 extremities equally PSYCH: AAOx3, appropriate affect   Assessment & Plan:   1. Depressed mood in the setting of recent life stressors. PHQ 12, so improved from 17 at her last visit, but subjectively patient feels the same as her last visit. She has tried lifestyle modifications to help improve her mood. She would like a medicine today. - FLUoxetine (PROZAC) 20 MG tablet QD, titrate up as  needed - follow up in 2 weeks to check in on mood, repeat PHQ9 - warm hand off to [facility] in today's visit - red flags/reasons to call with starting this new med including common and serious side effects were dicussed  Everrett Coombe, MD,MS,  PGY3 11/02/2017 10:50 AM

## 2017-11-05 ENCOUNTER — Ambulatory Visit (HOSPITAL_BASED_OUTPATIENT_CLINIC_OR_DEPARTMENT_OTHER): Payer: BC Managed Care – PPO | Attending: Pulmonary Disease | Admitting: Pulmonary Disease

## 2017-11-05 VITALS — Ht 67.5 in | Wt 332.0 lb

## 2017-11-05 DIAGNOSIS — G4733 Obstructive sleep apnea (adult) (pediatric): Secondary | ICD-10-CM | POA: Insufficient documentation

## 2017-11-05 DIAGNOSIS — R0902 Hypoxemia: Secondary | ICD-10-CM | POA: Diagnosis not present

## 2017-11-05 DIAGNOSIS — G4730 Sleep apnea, unspecified: Secondary | ICD-10-CM

## 2017-11-07 ENCOUNTER — Telehealth: Payer: Self-pay

## 2017-11-07 NOTE — Telephone Encounter (Signed)
Patient left message that she has not started the Fluoxetine that was prescribed by PCP. Stated she wanted to see if she felt any better after starting to use CPAP.  Wanted to let PCP know and ask if this is ok.  Call back is [phone]  Danley Danker, RN [facility])

## 2017-11-08 NOTE — Telephone Encounter (Signed)
Informed pt that from would be up front for pick also told her that there is another envelope up there as well with her name on it. Regina Burns, April D, [place]

## 2017-11-08 NOTE — Telephone Encounter (Signed)
She does not have to start the Prozac if she does not want to.

## 2017-11-08 NOTE — Telephone Encounter (Signed)
I will place the form in an envelope at the front desk for her to pick up starting this afternoon.

## 2017-11-08 NOTE — Telephone Encounter (Signed)
Pt informed of below and she asked if the doctor had completed her physical form.  Routing to PCP to see about this. Katharina Caper, April D, [place]

## 2017-11-09 ENCOUNTER — Telehealth: Payer: Self-pay | Admitting: Pulmonary Disease

## 2017-11-09 DIAGNOSIS — G4730 Sleep apnea, unspecified: Secondary | ICD-10-CM

## 2017-11-09 NOTE — Telephone Encounter (Signed)
Called patient unable to reach left message to give us a call back.

## 2017-11-09 NOTE — Telephone Encounter (Signed)
Sleep study result  DME referral  CPAP therapy on 17 cm H2O with a Medium size Resmed Full Face Mask AirFit 20 for Her mask and heated humidification.  Follow-up as scheduled

## 2017-11-09 NOTE — Procedures (Signed)
POLYSOMNOGRAPHY  Last, First: Joretta, Eads MRN: [id number] Gender: Female Age (years): 62 Weight (lbs): 326 DOB: 02-Aug-1955 BMI: 51 Primary Care: Everrett Coombe Epworth Score: 4 Referring: Laurin Coder MD Technician: Jorge Ny Interpreting: Laurin Coder MD Study Type: CPAP Ordered Study Type: CPAP Study date: 11/05/2017 Location: [place] CLINICAL INFORMATION Trea Carnegie is a 62 year old Female and was referred to the sleep center for evaluation of G47.30 Sleep Apnea, Unspecified (780.57). Indications include Hypertension, Obesity, Restless Legs, Snoring (786.09).   Most recent polysomnogram dated 08/18/2017 revealed an AHI of 22.4/h and RDI of 22.8/h. Most recent titration study dated 11/05/2017 was optimal at 17cm H2O with an AHI of 22.4/h. MEDICATIONS Patient self administered medications include: GABAPENTIN, LOVASTATIN. Medications administered during study include No sleep medicine administered.  SLEEP STUDY TECHNIQUE The patient underwent an attended overnight polysomnography titration to assess the effects of CPAP therapy. The following variables were monitored: EEG(C4-A1, C3-A2, O1-A2, O2-A1), EOG, submental and leg EMG, ECG, oxyhemoglobin saturation by pulse oximetry, thoracic and abdominal respiratory effort belts, nasal/oral airflow by pressure sensor, body position sensor and snoring sensor. CPAP pressure was titrated to eliminate apneas, hypopneas and oxygen desaturation. Hypopneas were scored per AASM definition IB (4% desaturation)  TECHNICIAN COMMENTS Comments added by Technician: NONE Comments added by Scorer: N/A SLEEP ARCHITECTURE The study was initiated at 10:38:01 PM and terminated at 5:12:15 AM. Total recorded time was 394.2 minutes. EEG confirmed total sleep time was 358.5 minutes yielding a sleep efficiency of 90.9%%. Sleep onset after lights out was 2.3 minutes with a REM latency of 58.0 minutes. The patient spent 2.8%% of the  night in stage N1 sleep, 69.5%% in stage N2 sleep, 0.0%% in stage N3 and 27.8% in REM. The Arousal Index was 9.7/hour. RESPIRATORY PARAMETERS The overall AHI was 6.9 per hour, and the RDI was 6.9 events/hour with a central apnea index of 0.2per hour. The most appropriate setting of CPAP was 17 cm H2O. At this setting, the sleep efficiency was 80% and the patient was supine for 3%. The AHI was 2.5 events per hour, and the RDI was 2.5 events/hour (with 0.2 central events) and the arousal index was 3.7 per hour.The oxygen nadir was 75.0% during sleep.    The cumulative time under 88% oxygen saturation was 5.5 minutes  LEG MOVEMENT DATA The total leg movements were 6 with a resulting leg movement index of 1.0/hr. Associated arousal with leg movement index was 0.0/hr. CARDIAC DATA The underlying cardiac rhythm was most consistent with sinus rhythm. Mean heart rate during sleep was 67.4 bpm. Additional rhythm abnormalities include PVCs.   IMPRESSIONS - Electrocardiographic data showed presence of PVCs. - The patient snored with loud snoring volume. - Severe Oxygen Desaturation - Mild Obstructive Sleep apnea(OSA) Optimal pressure attained. - No Significant Central Sleep Apnea (CSA) - No significant periodic leg movements(PLMs) during sleep.  - Normal sleep efficiency, short primary sleep latency, short REM sleep latency and no slow wave latency.   DIAGNOSIS - Obstructive Sleep Apnea (327.23 [G47.33 ICD-10]) - Nocturnal Hypoxemia (327.26 [G47.36 ICD-10])   RECOMMENDATIONS - CPAP therapy on 17 cm H2O with a Medium size Resmed Full Face Mask AirFit 20 for Her mask and heated humidification. - Avoid alcohol, sedatives and other CNS depressants that may worsen sleep apnea and disrupt normal sleep architecture. - Sleep hygiene should be reviewed to assess factors that may improve sleep quality. - Weight management and regular exercise should be initiated or continued. - Return to Sleep Center  for  re-evaluation after 4 weeks of therapy  [Electronically signed] 11/09/2017 05:44 AM  Sherrilyn Rist MD NPI: [id number]

## 2017-11-09 NOTE — Telephone Encounter (Signed)
Pt is calling about the cpap machine. [phone]

## 2017-11-10 NOTE — Telephone Encounter (Signed)
Spoke with the patient and she verbalized  Understanding and I have placed an order and have sent a order in to DME and have change apt till January for compliance report.

## 2017-11-14 ENCOUNTER — Other Ambulatory Visit: Payer: Self-pay | Admitting: Student in an Organized Health Care Education/Training Program

## 2017-11-16 ENCOUNTER — Ambulatory Visit: Payer: BC Managed Care – PPO | Admitting: Student in an Organized Health Care Education/Training Program

## 2017-11-17 ENCOUNTER — Other Ambulatory Visit: Payer: Self-pay

## 2017-11-17 NOTE — Telephone Encounter (Signed)
2nd request received. Please add refills if appropriate.  Danley Danker, RN [facility] RN)

## 2017-12-03 ENCOUNTER — Other Ambulatory Visit: Payer: Self-pay | Admitting: Student in an Organized Health Care Education/Training Program

## 2017-12-05 ENCOUNTER — Encounter (HOSPITAL_BASED_OUTPATIENT_CLINIC_OR_DEPARTMENT_OTHER): Payer: BC Managed Care – PPO

## 2017-12-07 ENCOUNTER — Ambulatory Visit: Payer: BC Managed Care – PPO | Admitting: Pulmonary Disease

## 2017-12-08 ENCOUNTER — Telehealth: Payer: Self-pay | Admitting: Pulmonary Disease

## 2017-12-08 DIAGNOSIS — G4730 Sleep apnea, unspecified: Secondary | ICD-10-CM

## 2017-12-08 NOTE — Telephone Encounter (Signed)
Called and spoke with patient she stated that she received her CPAP machine and wanted to make sure that everything was right. It was set at 4.0 and it was comfortable. She stated now it is up to 5.2 and the air feel uncomfortable. She would like this fixed.    AO please advise, thank you.

## 2017-12-08 NOTE — Telephone Encounter (Signed)
Pt is returning call. Cb is [phone].

## 2017-12-08 NOTE — Telephone Encounter (Signed)
Pt is aware that we are currently awaiting response from AO.

## 2017-12-08 NOTE — Telephone Encounter (Signed)
  Patient CPAP order should be for CPAP of 17--pressure of 17  Not sure where the level of 4, 5.2 that is being referred to is coming from  Best option would be to get a download from the machine for [place] to review--may give [place] an idea what she is talking about   If she feels machine is not working well, she is to contact the DME company to take a look at the machine

## 2017-12-09 NOTE — Telephone Encounter (Signed)
Spoke with patient and advise her of AO recommendations she verbalized understanding.  I have placed an order to decrease the pressures. Nothing further needed.

## 2017-12-09 NOTE — Telephone Encounter (Signed)
Call patient    I did review the download  Decrease pressure to 15 and we will follow-up with a download in a few weeks  Not sure about what was set at level 4 going to 5.2  Call with any concerns Contact DME if you feel machine not working well and they may help check it out

## 2017-12-09 NOTE — Telephone Encounter (Signed)
Attempted to look pt up in care everywhere but pt is not available.  Called [facility] to have pt added to system, was advised that pt is in [facility].  Cherina helped me obtain download from this system and has given download to AO.   AO please advise.  Thanks!

## 2017-12-27 ENCOUNTER — Ambulatory Visit: Payer: BC Managed Care – PPO | Admitting: Pulmonary Disease

## 2018-01-16 ENCOUNTER — Ambulatory Visit (INDEPENDENT_AMBULATORY_CARE_PROVIDER_SITE_OTHER): Payer: BC Managed Care – PPO | Admitting: Pulmonary Disease

## 2018-01-16 ENCOUNTER — Encounter: Payer: Self-pay | Admitting: Pulmonary Disease

## 2018-01-16 VITALS — BP 128/82 | HR 60 | Ht 68.0 in | Wt 340.0 lb

## 2018-01-16 DIAGNOSIS — G4719 Other hypersomnia: Secondary | ICD-10-CM | POA: Diagnosis not present

## 2018-01-16 DIAGNOSIS — Z9989 Dependence on other enabling machines and devices: Secondary | ICD-10-CM | POA: Diagnosis not present

## 2018-01-16 DIAGNOSIS — R0683 Snoring: Secondary | ICD-10-CM

## 2018-01-16 DIAGNOSIS — G4733 Obstructive sleep apnea (adult) (pediatric): Secondary | ICD-10-CM

## 2018-01-16 NOTE — Patient Instructions (Signed)
Obstructive sleep apnea on CPAP therapy  Download from the machine shows that it is working well  No changes to be made today  I will see you back in the office in about 6 months Call the office if you have any concerns Continue efforts at weight loss

## 2018-01-16 NOTE — Progress Notes (Signed)
Subjective:    Patient ID: Regina Burns, female    DOB: 04/16/1955, 62 y.o.   MRN: [id number] Chief complaint: History of moderate obstructive sleep apnea on CPAP therapy Seems to be working well  HPI History of snoring, diagnosed with sleep apnea on a recent sleep study Usually goes to bed about 10-12 AM Wakes up about 3-4 times during the night Takes about an hour to fall asleep Wakes up about 3:45 AM Feeling better waking up in the morning occasionally still gets sleepy  She is usually not sleepy during the day Feels the machine is helping No history of heart disease Reformed smoker Able to concentrate well No history of heart disease  Past Medical History:  Diagnosis Date  . GERD (gastroesophageal reflux disease) 2005  . Hyperlipidemia 2012  . Hypertension 2012   Social History   Tobacco Use  . Smoking status: Former Smoker    Last attempt to quit: 01/18/1998    Years since quitting: 20.0  . Smokeless tobacco: Never Used  Substance Use Topics  . Alcohol use: No  . Drug use: No    Frequency: 3.0 times per week   Family History  Problem Relation Age of Onset  . Alcohol abuse Mother   . Alzheimer's disease Mother   . Heart disease Mother   . Diabetes Mother   . Hypertension Mother   . Stroke Mother   . Heart disease Father   . Diabetes Father   . Hyperlipidemia Father   . Hypertension Father   . Alzheimer's disease Maternal Aunt   . Hypertension Paternal Aunt   . Hypertension Paternal Uncle   . Hypertension Maternal Grandmother   . Breast cancer Neg Hx    Review of Systems  Constitutional: Negative.   HENT: Negative.   Eyes: Negative.   Respiratory: Positive for apnea.   Cardiovascular: Negative.   Gastrointestinal: Negative.   Psychiatric/Behavioral: Positive for sleep disturbance.   There were no vitals filed for this visit.    Objective:   Physical Exam Constitutional:      General: She is not in acute distress.    Appearance:  She is well-developed. She is not diaphoretic.  HENT:     Head: Normocephalic and atraumatic.     Nose: Nose normal.  Eyes:     General:        Right eye: No discharge.        Left eye: No discharge.     Pupils: Pupils are equal, round, and reactive to light.  Neck:     Musculoskeletal: Neck supple.     Trachea: No tracheal deviation.  Cardiovascular:     Rate and Rhythm: Normal rate and regular rhythm.     Heart sounds: No murmur.  Pulmonary:     Effort: Pulmonary effort is normal. No respiratory distress.     Breath sounds: Normal breath sounds. No stridor. No wheezing, rhonchi or rales.  Neurological:     Mental Status: She is alert.     Sleep study reviewed by myself showing moderate obstructive sleep apnea with moderately severe oxygen desaturations Compliance download revealing 93% compliance Residual AHI of 2 Epworth Sleepiness Scale of 11     Assessment & Plan:  .  Moderately severe obstructive sleep apnea with moderately severe oxygen desaturations -Adequately treated with CPAP therapy -Compliant with CPAP use -Improved daytime symptoms  .  Morbid obesity -Importance of weight loss and exercise discussed with the patient  .  Reformed smoker  Plan. Continue CPAP use at current pressure  Exercise and diet discussed  Pathophysiology of sleep disordered breathing discussed  I will see her in the office about 6 months

## 2018-02-15 ENCOUNTER — Ambulatory Visit: Payer: BC Managed Care – PPO | Admitting: Pulmonary Disease

## 2018-03-12 ENCOUNTER — Other Ambulatory Visit: Payer: Self-pay | Admitting: Student in an Organized Health Care Education/Training Program

## 2018-03-20 ENCOUNTER — Ambulatory Visit: Payer: BC Managed Care – PPO | Admitting: Pulmonary Disease

## 2018-03-20 ENCOUNTER — Ambulatory Visit: Payer: BC Managed Care – PPO | Admitting: Acute Care

## 2018-07-10 ENCOUNTER — Other Ambulatory Visit: Payer: Self-pay | Admitting: Student in an Organized Health Care Education/Training Program

## 2018-08-28 ENCOUNTER — Other Ambulatory Visit: Payer: Self-pay

## 2018-08-29 ENCOUNTER — Ambulatory Visit: Payer: BC Managed Care – PPO | Admitting: Women's Health

## 2018-09-05 ENCOUNTER — Ambulatory Visit: Payer: BC Managed Care – PPO | Admitting: Women's Health

## 2018-09-07 ENCOUNTER — Other Ambulatory Visit: Payer: Self-pay

## 2018-09-11 ENCOUNTER — Ambulatory Visit: Payer: BC Managed Care – PPO | Admitting: Women's Health

## 2018-09-11 DIAGNOSIS — Z0289 Encounter for other administrative examinations: Secondary | ICD-10-CM

## 2018-10-18 ENCOUNTER — Ambulatory Visit: Payer: BC Managed Care – PPO | Admitting: Family Medicine

## 2018-10-24 ENCOUNTER — Other Ambulatory Visit: Payer: Self-pay

## 2018-10-24 MED ORDER — ATENOLOL-CHLORTHALIDONE 100-25 MG PO TABS
1.0000 | ORAL_TABLET | Freq: Every day | ORAL | 3 refills | Status: DC
Start: 2018-10-24 — End: 2018-10-30

## 2018-10-26 ENCOUNTER — Encounter: Payer: Self-pay | Admitting: Family Medicine

## 2018-10-26 ENCOUNTER — Ambulatory Visit (INDEPENDENT_AMBULATORY_CARE_PROVIDER_SITE_OTHER): Payer: BC Managed Care – PPO | Admitting: Family Medicine

## 2018-10-26 ENCOUNTER — Other Ambulatory Visit: Payer: Self-pay

## 2018-10-26 VITALS — BP 144/62 | HR 71 | Ht 68.0 in | Wt 341.4 lb

## 2018-10-26 DIAGNOSIS — Z1211 Encounter for screening for malignant neoplasm of colon: Secondary | ICD-10-CM

## 2018-10-26 DIAGNOSIS — I10 Essential (primary) hypertension: Secondary | ICD-10-CM

## 2018-10-26 MED ORDER — CHLORTHALIDONE 25 MG PO TABS: 25.0000 mg | ORAL_TABLET | Freq: Every day | ORAL | 3 refills | Status: DC

## 2018-10-26 MED ORDER — CARVEDILOL 6.25 MG PO TABS: 6.2500 mg | ORAL_TABLET | Freq: Two times a day (BID) | ORAL | 3 refills | Status: DC

## 2018-10-26 NOTE — Patient Instructions (Addendum)
It was a pleasure meeting you today.  Stop taking your Tenoretic.  New medication Coreg 6.25 mg.  Take 1 tablet in the morning and 1 tablet in the evening. Take chlorthalidone 25 mg in the morning.  Log blood pressures daily at different times.  If you have any blurry vision, headaches, chest pain, shortness of breath seek medical attention immediately.  Follow-up in 2 weeks or sooner if needed.  Carollee Leitz MD

## 2018-10-30 NOTE — Assessment & Plan Note (Signed)
Will discontinue Tenoretic. Start carvedilol 6.25 mg twice daily Start chlorthalidone 25 mg daily Monitor blood pressure and keep blood pressure diary Follow-up in 2 weeks

## 2018-10-30 NOTE — Progress Notes (Signed)
  Patient Name: [name] Date of Birth: 1955-02-20 Date of Visit: 10/30/18 PCP: Carollee Leitz, MD  Chief Complaint:   Subjective: Regina Burns is a pleasant 63 y.o. with medical history significant for hypertension, hyperlipidemia presenting today for increasing blood pressure.   Hypertension Patient concerned because systolic blood pressure 0000000 to 160s.  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  Patient currently denies any dizziness, headaches, shortness of breath, chest pain.  No visual changes.  She is currently taking Tenoretic 100-25 mg daily.  Last creatinine 1.06 in July 2019.  BP today 144/62   ROS: Per HPI.   I have reviewed the patient's medical, surgical, family, and social history as appropriate.  Vitals:   10/26/18 1554  BP: (!) 144/62  Pulse: 71  SpO2: 97%   General: Pleasant 63 year old female in no acute distress Cardiovascular: Regular rate and rhythm, no murmurs appreciated Respiratory: Clear to auscultation bilaterally, no crackles, no rhonchi Abdomen: Obese, soft, nontender, bowel sounds present   HYPERTENSION, BENIGN ESSENTIAL Will discontinue Tenoretic. Start carvedilol 6.25 mg twice daily Start chlorthalidone 25 mg daily Monitor blood pressure and keep blood pressure diary Follow-up in 2 weeks  Health maintenance Cologuard ordered  Return to care in 2 weeks or sooner if  needed.   Carollee Leitz, MD  Family Medicine Teaching Service

## 2018-11-07 ENCOUNTER — Encounter: Payer: BC Managed Care – PPO | Admitting: Obstetrics & Gynecology

## 2018-11-20 ENCOUNTER — Other Ambulatory Visit: Payer: Self-pay | Admitting: Family Medicine

## 2018-12-11 ENCOUNTER — Ambulatory Visit: Payer: BC Managed Care – PPO | Admitting: Family Medicine

## 2018-12-13 ENCOUNTER — Encounter (HOSPITAL_COMMUNITY): Payer: Self-pay | Admitting: Emergency Medicine

## 2018-12-13 ENCOUNTER — Ambulatory Visit (HOSPITAL_COMMUNITY)
Admission: EM | Admit: 2018-12-13 | Discharge: 2018-12-13 | Disposition: A | Discharge status: Home / Self Care | Payer: BC Managed Care – PPO | Attending: Family Medicine | Admitting: Family Medicine

## 2018-12-13 ENCOUNTER — Other Ambulatory Visit: Payer: Self-pay

## 2018-12-13 DIAGNOSIS — S61219A Laceration without foreign body of unspecified finger without damage to nail, initial encounter: Secondary | ICD-10-CM

## 2018-12-13 DIAGNOSIS — I10 Essential (primary) hypertension: Secondary | ICD-10-CM

## 2018-12-13 NOTE — Discharge Instructions (Signed)
Your blood pressure was noted to be elevated during your visit today. You may return here within the next few days to recheck if unable to see your primary care doctor.  BP (!) 158/82 (BP Location: Right Arm) Comment (BP Location): regular cuff, right forearm   Pulse 77    Temp 98.5 F (36.9 C) (Oral)    Resp 20    SpO2 100%

## 2018-12-13 NOTE — ED Triage Notes (Signed)
while slicing potatoes today, did cut her finger.  Patient has laceration to tip of left index finger, partial avulsion

## 2018-12-13 NOTE — ED Provider Notes (Signed)
[place]   [id number] 12/13/18 Arrival Time: [place] PLAN:  1. Finger laceration, initial encounter   2. Essential hypertension     No repair needed. Cleaned. Dressing applied. See AVS for simple wound care instructions.  Follow-up Information    [place].   Specialty: Urgent Care Why: If worsening or failing to improve as anticipated. Contact information: [place] [phone]           Discharge Instructions     Your blood pressure was noted to be elevated during your visit today. You may return here within the next few days to recheck if unable to see your primary care doctor.  BP (!) 158/82 (BP Location: Right Arm) Comment (BP Location): regular cuff, right forearm  Pulse 77   Temp 98.5 F (36.9 C) (Oral)   Resp 20   SpO2 100%     Reviewed expectations re: course of current medical issues. Questions answered. Outlined signs and symptoms indicating need for more acute intervention. Patient verbalized understanding. After Visit Summary given.   SUBJECTIVE:  Regina Burns is a 63 y.o. female who presents with a laceration of her LEFT second fingertip not involving the nail. Few hours ago while slicing potatoes. Moderate bleeding; easily controlled. Minimal discomfort. No extremity sensation changes or weakness. Td UTD: Yes.  Increased blood pressure noted today. Reports that she is treated for BP.  She reports taking medications as instructed, no medication side effects noted, no chest pain on exertion, no dyspnea on exertion, no swelling of ankles, no orthostatic dizziness or lightheadedness, no orthopnea or paroxysmal nocturnal dyspnea, no palpitations and no intermittent claudication symptoms.  ROS: As per HPI.  Health Maintenance Due  Topic Date Due  . COLONOSCOPY  07/10/2005    OBJECTIVE:  Vitals:   12/13/18 1519  BP: (!) 158/82   Pulse: 77  Resp: 20  Temp: 98.5 F (36.9 C)  TempSrc: Oral  SpO2: 100%     General appearance: alert; no distress CV: RRR Lungs: unlabored respirations Skin: curved laceration/partial avulsion of her LEFT second fingertip; nail intact; clean wound edges, no foreign bodies; without active bleeding; finger with FROM and normal strength; no subungual hematomoa Psychological: alert and cooperative; normal mood and affect   Allergies  Allergen Reactions  . Ace Inhibitors Swelling  . Lisinopril Swelling    Was hospitalized   . Clindamycin/Lincomycin Hives  . Contrast Media [Iodinated Diagnostic Agents] Hives    Past Medical History:  Diagnosis Date  . GERD (gastroesophageal reflux disease) 2005  . Hyperlipidemia 2012  . Hypertension 2012   Social History   Socioeconomic History  . Marital status: Widowed    Spouse name: Not on file  . Number of children: 1  . Years of education: Not on file  . Highest education level: Not on file  Occupational History  . Occupation: UPPORT Firefighter: [employer]  Social Needs  . Financial resource strain: Not on file  . Food insecurity    Worry: Not on file    Inability: Not on file  . Transportation needs    Medical: Not on file    Non-medical: Not on file  Tobacco Use  . Smoking status: Former Smoker    Quit date: 01/18/1998    Years since quitting: 20.9  . Smokeless tobacco: Never Used  Substance and Sexual Activity  . Alcohol use: No  . Drug use:  No    Frequency: 3.0 times per week  . Sexual activity: Not Currently  Lifestyle  . Physical activity    Days per week: Not on file    Minutes per session: Not on file  . Stress: Not on file  Relationships  . Social Herbalist on phone: Not on file    Gets together: Not on file    Attends religious service: Not on file    Active member of club or organization: Not on file    Attends meetings of clubs or organizations: Not on file     Relationship status: Not on file  Other Topics Concern  . Not on file  Social History Narrative   Works at [employer].          Vanessa Kick, MD 12/13/18 [phone]

## 2018-12-25 ENCOUNTER — Encounter: Payer: BC Managed Care – PPO | Admitting: Obstetrics & Gynecology

## 2019-01-08 ENCOUNTER — Other Ambulatory Visit: Payer: Self-pay | Admitting: Student in an Organized Health Care Education/Training Program

## 2019-03-30 ENCOUNTER — Ambulatory Visit: Payer: BC Managed Care – PPO | Admitting: Family Medicine

## 2019-04-18 ENCOUNTER — Ambulatory Visit (INDEPENDENT_AMBULATORY_CARE_PROVIDER_SITE_OTHER): Payer: BC Managed Care – PPO | Admitting: Family Medicine

## 2019-04-18 ENCOUNTER — Other Ambulatory Visit: Payer: Self-pay

## 2019-04-18 ENCOUNTER — Encounter: Payer: Self-pay | Admitting: Family Medicine

## 2019-04-18 VITALS — BP 120/62 | HR 68 | Ht 68.0 in | Wt 319.0 lb

## 2019-04-18 DIAGNOSIS — I10 Essential (primary) hypertension: Secondary | ICD-10-CM | POA: Diagnosis not present

## 2019-04-18 DIAGNOSIS — Z0000 Encounter for general adult medical examination without abnormal findings: Secondary | ICD-10-CM | POA: Diagnosis not present

## 2019-04-18 DIAGNOSIS — G629 Polyneuropathy, unspecified: Secondary | ICD-10-CM | POA: Diagnosis not present

## 2019-04-18 DIAGNOSIS — R7309 Other abnormal glucose: Secondary | ICD-10-CM

## 2019-04-18 NOTE — Patient Instructions (Addendum)
It was a pleasure seeing you again.  You were seen for Blood Pressure follow up. Your  blood pressure looks okay today.  I would like to continue with your blood pressure medications as scheduled and follow-up in 4 weeks for further evaluation.  I have ordered some blood work for you today.  I will call you with results if they are abnormal.  Otherwise to continue them through my chart and we can discuss them at your next visit.  I have booked an appointment for follow up for May 3 at 415 pm [employer] on your weight loss.  Keep up the great work.  Stay Safe  Carollee Leitz MD

## 2019-04-19 LAB — BASIC METABOLIC PANEL
BUN/Creatinine Ratio: 11 — ABNORMAL LOW (ref 12–28)
BUN: 11 mg/dL (ref 8–27)
CO2: 25 mmol/L (ref 20–29)
Calcium: 9.7 mg/dL (ref 8.7–10.3)
Chloride: 104 mmol/L (ref 96–106)
Creatinine, Ser: 1.02 mg/dL — ABNORMAL HIGH (ref 0.57–1.00)
GFR calc Af Amer: 68 mL/min/{1.73_m2} (ref >59)
GFR calc non Af Amer: 59 mL/min/{1.73_m2} — ABNORMAL LOW (ref >59)
Glucose: 89 mg/dL (ref 65–99)
Potassium: 3.8 mmol/L (ref 3.5–5.2)
Sodium: 143 mmol/L (ref 134–144)

## 2019-04-19 LAB — HIV ANTIBODY (ROUTINE TESTING W REFLEX): HIV Screen 4th Generation wRfx: NONREACTIVE

## 2019-04-19 LAB — CBC
Hematocrit: 40.3 % (ref 34.0–46.6)
Hemoglobin: 13.1 g/dL (ref 11.1–15.9)
MCH: 26.2 pg — ABNORMAL LOW (ref 26.6–33.0)
MCHC: 32.5 g/dL (ref 31.5–35.7)
MCV: 81 fL (ref 79–97)
Platelets: 307 10*3/uL (ref 150–450)
RBC: 5 x10E6/uL (ref 3.77–5.28)
RDW: 15.9 % — ABNORMAL HIGH (ref 11.7–15.4)
WBC: 5.6 10*3/uL (ref 3.4–10.8)

## 2019-04-19 LAB — VITAMIN B12: Vitamin B-12: 647 pg/mL (ref 232–1245)

## 2019-04-19 LAB — LIPID PANEL
Chol/HDL Ratio: 3.6 ratio (ref 0.0–4.4)
Cholesterol, Total: 178 mg/dL (ref 100–199)
HDL: 49 mg/dL (ref >39)
LDL Chol Calc (NIH): 108 mg/dL — ABNORMAL HIGH (ref 0–99)
Triglycerides: 118 mg/dL (ref 0–149)
VLDL Cholesterol Cal: 21 mg/dL (ref 5–40)

## 2019-04-19 LAB — TSH: TSH: 1.23 u[IU]/mL (ref 0.450–4.500)

## 2019-04-21 DIAGNOSIS — G629 Polyneuropathy, unspecified: Secondary | ICD-10-CM | POA: Insufficient documentation

## 2019-04-21 LAB — T PALLIDUM ANTIBODY, EIA: T pallidum Antibody, EIA: NEGATIVE

## 2019-04-21 LAB — RPR W/REFLEX TO TREPSURE: RPR: NONREACTIVE

## 2019-04-21 NOTE — Assessment & Plan Note (Signed)
Immunizations up-to-date. Encouraged weight loss A1c Lipid profile Mammogram referral We will discuss colonoscopy at next visit

## 2019-04-21 NOTE — Progress Notes (Signed)
    SUBJECTIVE:   CHIEF COMPLAINT / HPI: Follow-up blood pressure  Hypertension Patient reports currently doing well.  Denies any dizziness, headaches, shortness of breath, chest pain, or abdominal pain.  She reports she has been walking for the last 3 weeks 20 minutes at a time.  Her walking labs have increased to 5 labs daily.  She reports initially having some shortness of breath on exertion but that has now subsided.  Most likely due to deconditioning.  She reports having lost about 30 pounds since starting a diet 01/21.  Monitor blood pressure at home.  SBP 120s-150s, with occasional 170s.  She is tolerating carvedilol and chlorthalidone.  Neuropathy Patient reports periodic burning sensation bilateral plantar surfaces.  She reports taking gabapentin with relief.  Denies any swelling, decrease in sensation or trauma.  Does not interfere with ADLs.  She reports history of restless leg syndrome but feels that this is not related.  PERTINENT  PMH / PSH:  PMHx: Hypertension, OSA uses CPAP at night.,  Hyperlipidemia, restless leg syndrome, bilateral feet burning SHx: No tobacco or EtOH use  OBJECTIVE:   BP 120/62   Pulse 68   Ht 5\' 8"  (1.727 m)   Wt (!) 319 lb (144.7 kg)   SpO2 99%   BMI 48.50 kg/m    General: Alert and oriented, no apparent distress  Cardiovascular: RRR with no murmurs noted Respiratory: CTA bilaterally  Gastrointestinal: Bowel sounds present. No abdominal pain  ASSESSMENT/PLAN:   HYPERTENSION, BENIGN ESSENTIAL Blood pressures improving.  Given recent increase in exercise and weight loss will hold off on increasing any medications. -Continue carvedilol 6.25 mg twice daily -Continue chlorthalidone 25 mg daily -BMet -Follow-up in 4 weeks appointment scheduled for May 3 -If no decrease in blood pressure consider medication adjustment  Peripheral neuropathy Patient reports bilateral plantar surface burning sensation.  Currently taking gabapentin with relief.   Denies any pain but reports periodic tingling sensation. -A1c, TSH, RPR, HIV, vitamin D -Continue gabapentin 300 mg -Foot exam at next visit -Follow-up as needed  Healthcare maintenance Immunizations up-to-date. Encouraged weight loss A1c Lipid profile Mammogram referral We will discuss colonoscopy at next visit     Carollee Leitz, MD [place]

## 2019-04-21 NOTE — Assessment & Plan Note (Addendum)
Blood pressures improving.  Given recent increase in exercise and weight loss will hold off on increasing any medications. -Continue carvedilol 6.25 mg twice daily -Continue chlorthalidone 25 mg daily -BMet -Follow-up in 4 weeks appointment scheduled for May 3 -If no decrease in blood pressure consider medication adjustment

## 2019-04-21 NOTE — Assessment & Plan Note (Addendum)
Patient reports bilateral plantar surface burning sensation.  Currently taking gabapentin with relief.  Denies any pain but reports periodic tingling sensation. -A1c, TSH, RPR, HIV, vitamin D -Continue gabapentin 300 mg -Foot exam at next visit -Follow-up as needed

## 2019-05-18 ENCOUNTER — Other Ambulatory Visit: Payer: Self-pay | Admitting: Family Medicine

## 2019-05-21 ENCOUNTER — Ambulatory Visit: Payer: BC Managed Care – PPO | Admitting: Family Medicine

## 2019-06-05 ENCOUNTER — Telehealth: Payer: Self-pay | Admitting: *Deleted

## 2019-06-05 NOTE — Telephone Encounter (Signed)
-----   Message from [place] Pink, [place] sent at 05/25/2019  9:55 AM EDT ----- Regarding: Cologuard Cologuard due

## 2019-06-05 NOTE — Telephone Encounter (Signed)
Contacted pt to remind her to complete her cologuard before it expires.Kelijah Towry Zimmerman Rumple, CMA

## 2019-07-02 ENCOUNTER — Ambulatory Visit: Payer: BC Managed Care – PPO

## 2019-08-30 ENCOUNTER — Telehealth: Payer: Self-pay | Admitting: Pulmonary Disease

## 2019-08-30 NOTE — Telephone Encounter (Signed)
ATC patient, had to leave a VM.  Requested a call back so we could provide recommendations.  Will await a return call.

## 2019-08-30 NOTE — Telephone Encounter (Signed)
Spoke with patient regarding recall on cpap machine.  She states she has not been using the machine since the recall recommended not using it and she does not have severe sleep apnea.  She continues to walk 3 miles per day and has lost 70 lbs.  She is concerned that she is experiencing some daytime sleepiness since she is not using the cpap and also states that since she has been out for the summer she has been staying up late at night and that could be part of the sleepiness.  She stated she would start going back to bed earlier since school will be starting back soon and see if that helps.  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  She states it had been controlled with exercise and the cpap around 321 systolic, at times now it is around 224 systolic now.  She does not want to lose her license as they want the bp at or below 825 systolic.  She is on bp meds as well.  Dr. Ander Slade please advise.  Thank you.

## 2019-08-30 NOTE — Telephone Encounter (Signed)
Medications for hypertension may need to be optimized separate from CPAP use  If she did notice a good correlation between using CPAP on a regular basis and control of her blood pressure, significant improvement in daytime sleepiness with CPAP use  My recommendation would be to continue CPAP use-it is a risk versus benefit issue  -Until we get to a point where the company is able to provide replacements, there are no other tools available to [place]. -Regarding the machine, the significant risk is with people who were using ozone cleaners. -Unrealistic option for people will be to purchase a machine that is not recalled-this will be out-of-pocket.

## 2019-08-30 NOTE — Telephone Encounter (Signed)
Called patient and provided recommendations per Dr. Ander Slade:  Medications for hypertension may need to be optimized separate from CPAP use  If she did notice a good correlation between using CPAP on a regular basis and control of her blood pressure, significant improvement in daytime sleepiness with CPAP use  My recommendation would be to continue CPAP use-it is a risk versus benefit issue  -Until we get to a point where the company is able to provide replacements, there are no other tools available to [place]. -Regarding the machine, the significant risk is with people who were using ozone cleaners. -Unrealistic option for people will be to purchase a machine that is not recalled-this will be out-of-pocket.  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  She says it was creeping up prior to stopping the CPAP, but it went from 474-259 systolic since stopping the CPAP.  She said she would start back on the CPAP and monitor her bp.  She will contact us back if she needs anything further.  Nothing further needed at this time.

## 2019-09-18 ENCOUNTER — Other Ambulatory Visit: Payer: Self-pay | Admitting: Family Medicine

## 2019-10-08 ENCOUNTER — Encounter (HOSPITAL_COMMUNITY): Payer: Self-pay

## 2019-10-08 ENCOUNTER — Other Ambulatory Visit: Payer: Self-pay

## 2019-10-08 ENCOUNTER — Ambulatory Visit (HOSPITAL_COMMUNITY)
Admission: EM | Admit: 2019-10-08 | Discharge: 2019-10-08 | Disposition: A | Discharge status: Home / Self Care | Payer: BC Managed Care – PPO | Attending: Internal Medicine | Admitting: Internal Medicine

## 2019-10-08 DIAGNOSIS — Z20822 Contact with and (suspected) exposure to covid-19: Secondary | ICD-10-CM | POA: Insufficient documentation

## 2019-10-08 NOTE — ED Triage Notes (Signed)
Pt presents for COVID testing. Pt denies fever, shortness of breath, cough, or any other symptoms.    

## 2019-10-09 LAB — SARS CORONAVIRUS 2 (TAT 6-24 HRS): SARS Coronavirus 2: NEGATIVE

## 2019-10-13 ENCOUNTER — Other Ambulatory Visit: Payer: Self-pay | Admitting: Family Medicine

## 2019-10-17 ENCOUNTER — Other Ambulatory Visit: Payer: Self-pay | Admitting: Family Medicine

## 2019-11-15 ENCOUNTER — Other Ambulatory Visit: Payer: Self-pay | Admitting: Family Medicine

## 2020-01-25 ENCOUNTER — Other Ambulatory Visit: Payer: Self-pay | Admitting: Family Medicine

## 2020-07-14 ENCOUNTER — Other Ambulatory Visit: Payer: Self-pay | Admitting: Family Medicine

## 2020-10-04 ENCOUNTER — Other Ambulatory Visit: Payer: Self-pay | Admitting: Family Medicine

## 2020-10-17 ENCOUNTER — Other Ambulatory Visit: Payer: Self-pay | Admitting: Family Medicine

## 2020-10-24 ENCOUNTER — Other Ambulatory Visit: Payer: Self-pay

## 2020-10-24 ENCOUNTER — Emergency Department (HOSPITAL_COMMUNITY): Payer: BC Managed Care – PPO

## 2020-10-24 ENCOUNTER — Emergency Department (HOSPITAL_COMMUNITY)
Admission: EM | Admit: 2020-10-24 | Discharge: 2020-10-25 | Disposition: A | Discharge status: Home / Self Care | Payer: BC Managed Care – PPO | Attending: Emergency Medicine | Admitting: Emergency Medicine

## 2020-10-24 DIAGNOSIS — Z7982 Long term (current) use of aspirin: Secondary | ICD-10-CM | POA: Insufficient documentation

## 2020-10-24 DIAGNOSIS — Z79899 Other long term (current) drug therapy: Secondary | ICD-10-CM | POA: Diagnosis not present

## 2020-10-24 DIAGNOSIS — I10 Essential (primary) hypertension: Secondary | ICD-10-CM | POA: Diagnosis not present

## 2020-10-24 DIAGNOSIS — R0789 Other chest pain: Secondary | ICD-10-CM | POA: Insufficient documentation

## 2020-10-24 DIAGNOSIS — Z87891 Personal history of nicotine dependence: Secondary | ICD-10-CM | POA: Diagnosis not present

## 2020-10-24 LAB — BASIC METABOLIC PANEL
Anion gap: 9 (ref 5–15)
BUN: 18 mg/dL (ref 8–23)
CO2: 27 mmol/L (ref 22–32)
Calcium: 9.4 mg/dL (ref 8.9–10.3)
Chloride: 104 mmol/L (ref 98–111)
Creatinine, Ser: 0.88 mg/dL (ref 0.44–1.00)
GFR, Estimated: >60 mL/min (ref >60)
Glucose, Bld: 99 mg/dL (ref 70–99)
Potassium: 3.4 mmol/L — ABNORMAL LOW (ref 3.5–5.1)
Sodium: 140 mmol/L (ref 135–145)

## 2020-10-24 LAB — CBC
HCT: 38.5 % (ref 36.0–46.0)
Hemoglobin: 12.8 g/dL (ref 12.0–15.0)
MCH: 26.7 pg (ref 26.0–34.0)
MCHC: 33.2 g/dL (ref 30.0–36.0)
MCV: 80.2 fL (ref 80.0–100.0)
Platelets: 307 10*3/uL (ref 150–400)
RBC: 4.8 MIL/uL (ref 3.87–5.11)
RDW: 14.6 % (ref 11.5–15.5)
WBC: 5.6 10*3/uL (ref 4.0–10.5)
nRBC: 0 % (ref 0.0–0.2)

## 2020-10-24 LAB — TROPONIN I (HIGH SENSITIVITY)
Troponin I (High Sensitivity): 4 ng/L (ref <18)
Troponin I (High Sensitivity): 4 ng/L (ref <18)

## 2020-10-24 NOTE — ED Provider Notes (Signed)
Emergency Medicine Provider Triage Evaluation Note  Regina Burns , a 65 y.o. female  was evaluated in triage.  Pt complains of chest pain left side radiating to left shoulder x3 days.  Pain has been intermittent.  Worsened today.  History of hypertension only.  Review of Systems  Positive: cp Negative: Sob, diaphoresis, N/V  Physical Exam  BP (!) 158/93 (BP Location: Right Arm)   Pulse 69   Temp 98.4 F (36.9 C) (Oral)   Resp 16   SpO2 98%  Gen:   Awake, no distress   Resp:  Normal effort  MSK:   Moves extremities without difficulty  Other:  RRR  Medical Decision Making  Medically screening exam initiated at 9:51 AM.  Appropriate orders placed.  Lorane Gell was informed that the remainder of the evaluation will be completed by another provider, this initial triage assessment does not replace that evaluation, and the importance of remaining in the ED until their evaluation is complete.  Work up initiated   Margarita Mail, PA-C 10/24/20 3225    Regan Lemming, MD 10/24/20 1050

## 2020-10-24 NOTE — ED Triage Notes (Signed)
Pt from work for eval of three days of palpitations and sharp non-radiating chest pain. 324 ASA and 1 nitro given by EMS without minimal relief. Denies n/v, shob, or dizziness.

## 2020-10-25 LAB — TROPONIN I (HIGH SENSITIVITY): Troponin I (High Sensitivity): 3 ng/L (ref <18)

## 2020-10-25 MED ORDER — ALUM & MAG HYDROXIDE-SIMETH 200-200-20 MG/5ML PO SUSP
30.0000 mL | Freq: Once | ORAL | Status: AC
  Administered 2020-10-25: 30 mL via ORAL
  Filled 2020-10-25: qty 30

## 2020-10-25 MED ORDER — ACETAMINOPHEN 500 MG PO TABS
1000.0000 mg | ORAL_TABLET | Freq: Once | ORAL | Status: AC
  Administered 2020-10-25: 1000 mg via ORAL
  Filled 2020-10-25: qty 2

## 2020-10-25 NOTE — ED Provider Notes (Signed)
[place] [facility] EMERGENCY DEPARTMENT Provider Note  CSN: [id number] Arrival date & time: 10/24/20 4332  Chief Complaint(s) Chest Pain  HPI Regina Burns is a 65 y.o. female with a past medical history listed below who presents to the emergency department with 3 days of intermittent left-sided chest discomfort described as sharp prickling pain lasting several seconds to minutes.  No alleviating or aggravating factors.  Not associated with shortness of breath.  No nausea or vomiting.  No recent fevers or infections.  No coughing or congestion.  No abdominal pain.   Chest Pain  Past Medical History Past Medical History:  Diagnosis Date   GERD (gastroesophageal reflux disease) 2005   Hyperlipidemia 2012   Hypertension 2012   Patient Active Problem List   Diagnosis Date Noted   Peripheral neuropathy 04/21/2019   Healthcare maintenance 12/29/2015   Restless leg syndrome 06/08/2014   Sleep apnea 12/31/2009   LEG EDEMA, BILATERAL 12/31/2009   HYPERTENSION, BENIGN ESSENTIAL 08/09/2006   OBESITY, NOS 03/17/2006   GASTROESOPHAGEAL REFLUX, NO ESOPHAGITIS 03/17/2006   Home Medication(s) Prior to Admission medications   Medication Sig Start Date End Date Taking? Authorizing Provider  aspirin EC 81 MG tablet Take 81 mg by mouth daily. Swallow whole.   Yes [provider]  carvedilol (COREG) 6.25 MG tablet TAKE 1 TABLET(6.25 MG) BY MOUTH TWICE DAILY WITH A MEAL 10/20/20  Yes Carollee Leitz, MD  chlorthalidone (HYGROTON) 25 MG tablet TAKE 1 TABLET(25 MG) BY MOUTH DAILY 10/06/20  Yes Carollee Leitz, MD  cycloSPORINE (RESTASIS) 0.05 % ophthalmic emulsion Place 1 drop into both eyes 2 (two) times daily. 10/11/20  Yes [provider]  Ferrous Sulfate (IRON PO) Take 1 tablet by mouth daily.   Yes [provider]  gabapentin (NEURONTIN) 300 MG capsule TAKE 1 CAPSULE(300 MG) BY MOUTH AT BEDTIME 07/14/20  Yes Carollee Leitz, MD  lovastatin (MEVACOR) 40 MG  tablet TAKE 1 TABLET BY MOUTH AT BEDTIME 11/15/19  Yes Carollee Leitz, MD                                                                                                                                    Past Surgical History Past Surgical History:  Procedure Laterality Date   [place]   Family History Family History  Problem Relation Age of Onset   Alcohol abuse Mother    Alzheimer's disease Mother    Heart disease Mother    Diabetes Mother    Hypertension Mother    Stroke Mother    Heart disease Father    Diabetes Father    Hyperlipidemia Father    Hypertension Father    Alzheimer's disease Maternal Aunt    Hypertension Paternal Aunt    Hypertension Paternal Uncle    Hypertension Maternal Grandmother    Breast cancer Neg Hx     Social History Social History  Tobacco Use   Smoking status: Former    Types: Cigarettes    Quit date: 01/18/1998    Years since quitting: 22.7   Smokeless tobacco: Never  Substance Use Topics   Alcohol use: No   Drug use: No    Frequency: 3.0 times per week   Allergies Ace inhibitors, Lisinopril, Clindamycin/lincomycin, and Contrast media [iodinated diagnostic agents]  Review of Systems Review of Systems  Cardiovascular:  Positive for chest pain.  All other systems are reviewed and are negative for acute change except as noted in the HPI  Physical Exam Vital Signs  I have reviewed the triage vital signs BP 140/81 (BP Location: Left Arm)   Pulse 73   Temp 98.4 F (36.9 C) (Oral)   Resp 20   SpO2 100%   Physical Exam Vitals reviewed.  Constitutional:      General: She is not in acute distress.    Appearance: She is well-developed. She is obese. She is not diaphoretic.  HENT:     Head: Normocephalic and atraumatic.     Nose: Nose normal.  Eyes:     General: No scleral icterus.       Right eye: No discharge.        Left eye: No discharge.     Conjunctiva/sclera: Conjunctivae normal.      Pupils: Pupils are equal, round, and reactive to light.  Cardiovascular:     Rate and Rhythm: Normal rate and regular rhythm.     Heart sounds: No murmur heard.   No friction rub. No gallop.  Pulmonary:     Effort: Pulmonary effort is normal. No respiratory distress.     Breath sounds: Normal breath sounds. No stridor. No rales.  Abdominal:     General: There is no distension.     Palpations: Abdomen is soft.     Tenderness: There is no abdominal tenderness.  Musculoskeletal:        General: No tenderness.     Cervical back: Normal range of motion and neck supple.  Skin:    General: Skin is warm and dry.     Findings: No erythema or rash.  Neurological:     Mental Status: She is alert and oriented to person, place, and time.    ED Results and Treatments Labs (all labs ordered are listed, but only abnormal results are displayed) Labs Reviewed  BASIC METABOLIC PANEL - Abnormal; Notable for the following components:      Result Value   Potassium 3.4 (*)    All other components within normal limits  CBC  TROPONIN I (HIGH SENSITIVITY)  TROPONIN I (HIGH SENSITIVITY)  TROPONIN I (HIGH SENSITIVITY)                                                                                                                         EKG  EKG Interpretation  Date/Time:  Friday October 24 2020 09:47:40 EDT Ventricular Rate:  70 PR Interval:  206 QRS  Duration: 90 QT Interval:  386 QTC Calculation: 416 R Axis:   -6 Text Interpretation: Normal sinus rhythm Moderate voltage criteria for LVH, may be normal variant ( R in aVL , Cornell product ) Abnormal ECG Confirmed by Addison Lank [phone]) on 10/24/2020 11:13:56 PM       Radiology DG Chest 2 View  Result Date: 10/24/2020 CLINICAL DATA:  Chest pressure since early this morning. EXAM: CHEST - 2 VIEW COMPARISON:  CT chest 01/21/2016 FINDINGS: The heart size and mediastinal contours are within normal limits. Both lungs are clear. No pleural  effusion or pneumothorax. 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 IMPRESSION: No active cardiopulmonary disease. Electronically Signed   By: Ileana Roup M.D.   On: 10/24/2020 10:31    Pertinent labs & imaging results that were available during my care of the patient were reviewed by me and considered in my medical decision making (see MDM for details).  Medications Ordered in ED Medications  alum & mag hydroxide-simeth (MAALOX/MYLANTA) 200-200-20 MG/5ML suspension 30 mL (30 mLs Oral Given 10/25/20 0143)                                                                                                                                     Procedures Procedures  (including critical care time)  Medical Decision Making / ED Course I have reviewed the nursing notes for this encounter and the patient's prior records (if available in EHR or on provided paperwork).  Regina Burns was evaluated in Emergency Department on 10/25/2020 for the symptoms described in the history of present illness. She was evaluated in the context of the global COVID-19 pandemic, which necessitated consideration that the patient might be at risk for infection with the SARS-CoV-2 virus that causes COVID-19. Institutional protocols and algorithms that pertain to the evaluation of patients at risk for COVID-19 are in a state of rapid change based on information released by regulatory bodies including the CDC and federal and state organizations. These policies and algorithms were followed during the patient's care in the ED.  Clinical Course as of 10/25/20 0456  Sat Oct 25, 2020  0121 Intermittent and recurrent atypical chest pain. EKG without acute ischemic changes or evidence of pericarditis. Patient has been waiting for 15 hours. Her initial troponins were negative x2. Will repeat a third troponin to ensure stability. Low suspicion for ACS.  Presentation not classic for either  dissection or esophageal perforation. Low suspicion for pulmonary embolism.  Chest x-ray without evidence suggestive of pneumonia, pneumothorax, pneumomediastinum.  No abnormal contour of the mediastinum to suggest dissection. No evidence of acute injuries.  [PC]  3532 Third troponin negative and stable. [PC]    Clinical Course User Index [PC] Daichi Moris, Grayce Sessions, MD     Pertinent labs & imaging results that were available during my care of the patient were reviewed by me and considered in my medical  decision making:    Final Clinical Impression(s) / ED Diagnoses Final diagnoses:  Intermittent left-sided chest pain   The patient appears reasonably screened and/or stabilized for discharge and I doubt any other medical condition or other [facility] requiring further screening, evaluation, or treatment in the ED at this time prior to discharge. Safe for discharge with strict return precautions.  Disposition: Discharge  Condition: Good  I have discussed the results, Dx and Tx plan with the patient/family who expressed understanding and agree(s) with the plan. Discharge instructions discussed at length. The patient/family was given strict return precautions who verbalized understanding of the instructions. No further questions at time of discharge.    ED Discharge Orders     None         Follow Up: Primary care provider  Call  to schedule an appointment for close follow up    This chart was dictated using voice recognition software.  Despite best efforts to proofread,  errors can occur which can change the documentation meaning.    Fatima Blank, MD 10/25/20 [phone]

## 2020-12-02 ENCOUNTER — Other Ambulatory Visit: Payer: Self-pay | Admitting: Family Medicine

## 2020-12-23 ENCOUNTER — Other Ambulatory Visit: Payer: Self-pay

## 2020-12-23 MED ORDER — CHLORTHALIDONE 25 MG PO TABS: ORAL_TABLET | ORAL | 0 refills | Status: DC

## 2020-12-23 NOTE — Telephone Encounter (Signed)
Patient calls nurse line requesting refill on chlorthalidone. Advised patient that BP follow up is needed. Scheduled for 12/19 with PCP.   Please advise if refill can be sent in to last until this scheduled appointment.   Talbot Grumbling, RN

## 2021-01-01 ENCOUNTER — Emergency Department (HOSPITAL_BASED_OUTPATIENT_CLINIC_OR_DEPARTMENT_OTHER)
Admission: EM | Admit: 2021-01-01 | Discharge: 2021-01-01 | Disposition: A | Discharge status: Home / Self Care | Payer: BC Managed Care – PPO | Attending: Emergency Medicine | Admitting: Emergency Medicine

## 2021-01-01 ENCOUNTER — Encounter (HOSPITAL_BASED_OUTPATIENT_CLINIC_OR_DEPARTMENT_OTHER): Payer: Self-pay

## 2021-01-01 ENCOUNTER — Other Ambulatory Visit: Payer: Self-pay

## 2021-01-01 DIAGNOSIS — S29012A Strain of muscle and tendon of back wall of thorax, initial encounter: Secondary | ICD-10-CM | POA: Insufficient documentation

## 2021-01-01 DIAGNOSIS — X500XXA Overexertion from strenuous movement or load, initial encounter: Secondary | ICD-10-CM | POA: Diagnosis not present

## 2021-01-01 DIAGNOSIS — Z79899 Other long term (current) drug therapy: Secondary | ICD-10-CM | POA: Diagnosis not present

## 2021-01-01 DIAGNOSIS — Z87891 Personal history of nicotine dependence: Secondary | ICD-10-CM | POA: Diagnosis not present

## 2021-01-01 DIAGNOSIS — I10 Essential (primary) hypertension: Secondary | ICD-10-CM | POA: Diagnosis not present

## 2021-01-01 DIAGNOSIS — Z7982 Long term (current) use of aspirin: Secondary | ICD-10-CM | POA: Diagnosis not present

## 2021-01-01 DIAGNOSIS — S29019A Strain of muscle and tendon of unspecified wall of thorax, initial encounter: Secondary | ICD-10-CM

## 2021-01-01 DIAGNOSIS — S299XXA Unspecified injury of thorax, initial encounter: Secondary | ICD-10-CM | POA: Diagnosis present

## 2021-01-01 MED ORDER — MELOXICAM 7.5 MG PO TABS: 7.5000 mg | ORAL_TABLET | Freq: Every day | ORAL | 0 refills | Status: DC

## 2021-01-01 MED ORDER — METHOCARBAMOL 500 MG PO TABS: 500.0000 mg | ORAL_TABLET | Freq: Three times a day (TID) | ORAL | 0 refills | Status: DC | PRN

## 2021-01-01 NOTE — ED Triage Notes (Signed)
Pt c/o muscle spasms to left mid back intermittently x 3 weeks. States worse with movement. Been taking tylenol without relief. States been carrying a heavy bag at work.

## 2021-01-01 NOTE — ED Provider Notes (Signed)
[facility] EMERGENCY DEPARTMENT Provider Note   CSN: [id number] Arrival date & time: 01/01/21  6213     History Chief Complaint  Patient presents with   Back Pain    Regina Burns is a 65 y.o. female.  Patient presents to the emergency department for evaluation of back pain.  Patient reports that she has been experiencing intermittent mid and upper left-sided back pain for several weeks.  Patient reports that the pain is aggravated by certain movements.  No cough, chest congestion, chest pain, shortness of breath.  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  She also has to carry a heavy bag at work that she thinks is causing some of the symptoms.  Pain does not radiate to extremities.  No midline pain.  She has been trying Tylenol without much improvement.      Past Medical History:  Diagnosis Date   GERD (gastroesophageal reflux disease) 2005   Hyperlipidemia 2012   Hypertension 2012    Patient Active Problem List   Diagnosis Date Noted   Peripheral neuropathy 04/21/2019   Healthcare maintenance 12/29/2015   Restless leg syndrome 06/08/2014   Sleep apnea 12/31/2009   LEG EDEMA, BILATERAL 12/31/2009   HYPERTENSION, BENIGN ESSENTIAL 08/09/2006   OBESITY, NOS 03/17/2006   GASTROESOPHAGEAL REFLUX, NO ESOPHAGITIS 03/17/2006    Past Surgical History:  Procedure Laterality Date   [place]     OB History     Gravida  4   Para  2   Term      Preterm      AB  2   Living  2      SAB      IAB      Ectopic      Multiple      Live Births              Family History  Problem Relation Age of Onset   Alcohol abuse Mother    Alzheimer's disease Mother    Heart disease Mother    Diabetes Mother    Hypertension Mother    Stroke Mother    Heart disease Father    Diabetes Father    Hyperlipidemia Father    Hypertension Father    Alzheimer's disease  Maternal Aunt    Hypertension Paternal Aunt    Hypertension Paternal Uncle    Hypertension Maternal Grandmother    Breast cancer Neg Hx     Social History   Tobacco Use   Smoking status: Former    Types: Cigarettes    Quit date: 01/18/1998    Years since quitting: 22.9   Smokeless tobacco: Never  Substance Use Topics   Alcohol use: No   Drug use: No    Frequency: 3.0 times per week    Home Medications Prior to Admission medications   Medication Sig Start Date End Date Taking? Authorizing Provider  meloxicam (MOBIC) 7.5 MG tablet Take 1 tablet (7.5 mg total) by mouth daily. 01/01/21  Yes Daron Stutz, Gwenyth Allegra, MD  methocarbamol (ROBAXIN) 500 MG tablet Take 1 tablet (500 mg total) by mouth every 8 (eight) hours as needed for muscle spasms. 01/01/21  Yes Arlee Santosuosso, Gwenyth Allegra, MD  aspirin EC 81 MG tablet Take 81 mg by mouth daily. Swallow whole.    [provider]  carvedilol (COREG) 6.25 MG tablet TAKE 1 TABLET(6.25 MG) BY MOUTH TWICE DAILY WITH  A MEAL 10/20/20   Carollee Leitz, MD  chlorthalidone (HYGROTON) 25 MG tablet TAKE 1 TABLET(25 MG) BY MOUTH DAILY 12/23/20   Carollee Leitz, MD  cycloSPORINE (RESTASIS) 0.05 % ophthalmic emulsion Place 1 drop into both eyes 2 (two) times daily. 10/11/20   [provider]  Ferrous Sulfate (IRON PO) Take 1 tablet by mouth daily.    [provider]  gabapentin (NEURONTIN) 300 MG capsule TAKE 1 CAPSULE(300 MG) BY MOUTH AT BEDTIME 12/03/20   Carollee Leitz, MD  lovastatin (MEVACOR) 40 MG tablet TAKE 1 TABLET BY MOUTH AT BEDTIME 11/15/19   Carollee Leitz, MD    Allergies    Ace inhibitors, Lisinopril, Clindamycin/lincomycin, and Contrast media [iodinated diagnostic agents]  Review of Systems   Review of Systems  Respiratory:  Negative for shortness of breath.   Musculoskeletal:  Positive for back pain.  All other systems reviewed and are negative.  Physical Exam Updated Vital Signs BP (!) 160/71 (BP Location: Right Arm)     Pulse 75    Temp 97.9 F (36.6 C) (Oral)    Resp 20    Ht 5\' 8"  (1.727 m)    Wt 127 kg    SpO2 100%    BMI 42.57 kg/m   Physical Exam Vitals and nursing note reviewed.  Constitutional:      General: She is not in acute distress.    Appearance: Normal appearance. She is well-developed.  HENT:     Head: Normocephalic and atraumatic.     Right Ear: Hearing normal.     Left Ear: Hearing normal.     Nose: Nose normal.  Eyes:     Conjunctiva/sclera: Conjunctivae normal.     Pupils: Pupils are equal, round, and reactive to light.  Cardiovascular:     Rate and Rhythm: Regular rhythm.     Heart sounds: S1 normal and S2 normal. No murmur heard.   No friction rub. No gallop.  Pulmonary:     Effort: Pulmonary effort is normal. No respiratory distress.     Breath sounds: Normal breath sounds.  Chest:     Chest wall: No tenderness.  Abdominal:     General: Bowel sounds are normal.     Palpations: Abdomen is soft.     Tenderness: There is no abdominal tenderness. There is no guarding or rebound. Negative signs include Murphy's sign and McBurney's sign.     Hernia: No hernia is present.  Musculoskeletal:        General: Normal range of motion.     Cervical back: Normal, normal range of motion and neck supple.     Thoracic back: Spasms present. No deformity or bony tenderness.     Lumbar back: Normal.       Back:  Skin:    General: Skin is warm and dry.     Findings: No rash.  Neurological:     Mental Status: She is alert and oriented to person, place, and time.     GCS: GCS eye subscore is 4. GCS verbal subscore is 5. GCS motor subscore is 6.     Cranial Nerves: No cranial nerve deficit.     Sensory: No sensory deficit.     Coordination: Coordination normal.  Psychiatric:        Speech: Speech normal.        Behavior: Behavior normal.        Thought Content: Thought content normal.    ED Results / Procedures / Treatments   Labs (  all labs ordered are listed, but only abnormal  results are displayed) Labs Reviewed - No data to display  EKG None  Radiology No results found.  Procedures Procedures   Medications Ordered in ED Medications - No data to display  ED Course  I have reviewed the triage vital signs and the nursing notes.  Pertinent labs & imaging results that were available during my care of the patient were reviewed by me and considered in my medical decision making (see chart for details).    MDM Rules/Calculators/A&P                           Patient presents to the emergency department for evaluation of left-sided midthoracic back pain.  Pain exacerbated by movements and carrying a heavy bag.  She has a normal neurologic exam.  No midline pain or tenderness.  No pulmonary or cardiac symptoms.  Does not require work-up or imaging at this time, treat as thoracic myofascial strain.  Final Clinical Impression(s) / ED Diagnoses Final diagnoses:  Thoracic myofascial strain, initial encounter    Rx / DC Orders ED Discharge Orders          Ordered    meloxicam (MOBIC) 7.5 MG tablet  Daily        01/01/21 0956    methocarbamol (ROBAXIN) 500 MG tablet  Every 8 hours PRN        01/01/21 0956             Orpah Greek, MD 01/01/21 [phone]

## 2021-01-05 ENCOUNTER — Other Ambulatory Visit: Payer: Self-pay

## 2021-01-05 ENCOUNTER — Encounter: Payer: Self-pay | Admitting: Family Medicine

## 2021-01-05 ENCOUNTER — Ambulatory Visit: Payer: BC Managed Care – PPO | Admitting: Family Medicine

## 2021-01-05 ENCOUNTER — Other Ambulatory Visit: Payer: Self-pay | Admitting: Family Medicine

## 2021-01-05 VITALS — BP 132/74 | HR 72 | Ht 68.0 in | Wt 307.0 lb

## 2021-01-05 DIAGNOSIS — Z0000 Encounter for general adult medical examination without abnormal findings: Secondary | ICD-10-CM | POA: Diagnosis not present

## 2021-01-05 DIAGNOSIS — G4730 Sleep apnea, unspecified: Secondary | ICD-10-CM

## 2021-01-05 DIAGNOSIS — Z23 Encounter for immunization: Secondary | ICD-10-CM

## 2021-01-05 DIAGNOSIS — Z780 Asymptomatic menopausal state: Secondary | ICD-10-CM

## 2021-01-05 DIAGNOSIS — Z1211 Encounter for screening for malignant neoplasm of colon: Secondary | ICD-10-CM

## 2021-01-05 DIAGNOSIS — Z1231 Encounter for screening mammogram for malignant neoplasm of breast: Secondary | ICD-10-CM

## 2021-01-05 DIAGNOSIS — Z13820 Encounter for screening for osteoporosis: Secondary | ICD-10-CM

## 2021-01-05 DIAGNOSIS — I10 Essential (primary) hypertension: Secondary | ICD-10-CM

## 2021-01-05 LAB — POCT GLYCOSYLATED HEMOGLOBIN (HGB A1C): HbA1c, POC (controlled diabetic range): 6 % (ref 0.0–7.0)

## 2021-01-05 MED ORDER — SHINGRIX 50 MCG/0.5ML IM SUSR
0.5000 mL | Freq: Once | INTRAMUSCULAR | 0 refills | Status: AC
Start: 2021-01-05 — End: 2021-01-05

## 2021-01-05 NOTE — Progress Notes (Signed)
° ° °  SUBJECTIVE:   CHIEF COMPLAINT / HPI: blood pressure check  No acute concerns today  Unable to check BP at home.  Has not been exercising as much lately but plans to get back at it soon.  Denies any chest pain, shortness of breath, abdominal pain or recent weight loss.    PERTINENT  PMH / PSH:  HTN Obesity class 3 OSA on CPAP  OBJECTIVE:   BP 132/74    Pulse 72    Ht 5\' 8"  (1.727 m)    Wt (!) 307 lb (139.3 kg)    SpO2 100%    BMI 46.68 kg/m    General: Alert, no acute distress Cardio: Normal S1 and S2, RRR, no r/m/g Pulm: CTAB, normal work of breathing Abdomen: Bowel sounds normal. Abdomen soft and non-tender.  Extremities: No peripheral edema.   ASSESSMENT/PLAN:   HYPERTENSION, BENIGN ESSENTIAL Initial BP elevated 148/87, repeat 132/74.  Compliant with antihypertensives at home.  Decrease in exercise and has increase in weight since last visit.  Asymptomatic.  Benign exam -Monitior BP at home, record readings and will review at next visit -Lifestyle changes -Continue current medications -Limit NSAID use -Cmet today -Follow up with PCP scheduled Jan 18  Healthcare maintenance Last PAP NILM, HPV negative 2018. Mammogram ordered Referral sent for colonoscopy DEXA ordered A1c, Lipid panel Shingrix vaccine, script sent PNA vaccine today Follow up with PCP for continued HCM  Sleep apnea Reports using CPAP nightly     Carollee Leitz, MD [place]

## 2021-01-05 NOTE — Patient Instructions (Signed)
Thank you for coming to see me today. It was a pleasure.   We will get some labs today.  If they are abnormal or we need to do something about them, I will call you.  If they are normal, I will send you a message on MyChart (if it is active) or a letter in the mail.  If you don't hear from [place] in 2 weeks, please call the office at the number below.   Shingles vaccine. Can be given at pharmacy.  Mammogram is due.  Will send referral  I have placed an order for Bone density imaging.  Please go to [place] at [employer] or at [facility] to have this completed.  You do not need an appointment, but if you would like to call them beforehand, their number is [phone].  We will contact you with your results afterwards.   You are due for a colonoscopy. I have placed a referral for this. They will call with an appointment.   Monitor your blood pressure at home.  Record readings and follow up in 2-4 weeks  Please follow-up with PCP Jan 18 at 950 am  If you have any questions or concerns, please do not hesitate to call the office at [phone].  Best,   Carollee Leitz, MD

## 2021-01-06 LAB — COMPREHENSIVE METABOLIC PANEL
ALT: 26 IU/L (ref 0–32)
AST: 20 IU/L (ref 0–40)
Albumin/Globulin Ratio: 1.1 — ABNORMAL LOW (ref 1.2–2.2)
Albumin: 3.9 g/dL (ref 3.8–4.8)
Alkaline Phosphatase: 98 IU/L (ref 44–121)
BUN/Creatinine Ratio: 11 — ABNORMAL LOW (ref 12–28)
BUN: 10 mg/dL (ref 8–27)
Bilirubin Total: 0.2 mg/dL (ref 0.0–1.2)
CO2: 28 mmol/L (ref 20–29)
Calcium: 10 mg/dL (ref 8.7–10.3)
Chloride: 105 mmol/L (ref 96–106)
Creatinine, Ser: 0.91 mg/dL (ref 0.57–1.00)
Globulin, Total: 3.5 g/dL (ref 1.5–4.5)
Glucose: 107 mg/dL — ABNORMAL HIGH (ref 70–99)
Potassium: 4.6 mmol/L (ref 3.5–5.2)
Sodium: 146 mmol/L — ABNORMAL HIGH (ref 134–144)
Total Protein: 7.4 g/dL (ref 6.0–8.5)
eGFR: 70 mL/min/{1.73_m2} (ref >59)

## 2021-01-06 LAB — LIPID PANEL
Chol/HDL Ratio: 3.4 ratio (ref 0.0–4.4)
Cholesterol, Total: 168 mg/dL (ref 100–199)
HDL: 50 mg/dL (ref >39)
LDL Chol Calc (NIH): 93 mg/dL (ref 0–99)
Triglycerides: 144 mg/dL (ref 0–149)
VLDL Cholesterol Cal: 25 mg/dL (ref 5–40)

## 2021-01-07 ENCOUNTER — Encounter: Payer: Self-pay | Admitting: Family Medicine

## 2021-01-07 NOTE — Assessment & Plan Note (Signed)
Reports using CPAP nightly

## 2021-01-07 NOTE — Assessment & Plan Note (Signed)
Last PAP NILM, HPV negative 2018. Mammogram ordered Referral sent for colonoscopy DEXA ordered A1c, Lipid panel Shingrix vaccine, script sent PNA vaccine today Follow up with PCP for continued HCM

## 2021-01-07 NOTE — Assessment & Plan Note (Addendum)
Initial BP elevated 148/87, repeat 132/74.  Compliant with antihypertensives at home.  Decrease in exercise and has increase in weight since last visit.  Asymptomatic.  Benign exam -Monitior BP at home, record readings and will review at next visit -Lifestyle changes -Continue current medications -Limit NSAID use -Cmet today -Follow up with PCP scheduled Jan 18

## 2021-01-13 ENCOUNTER — Telehealth: Payer: Self-pay

## 2021-01-13 ENCOUNTER — Encounter: Payer: Self-pay | Admitting: Family Medicine

## 2021-01-13 NOTE — Telephone Encounter (Signed)
Patient calls nurse line regarding elevated BP readings. Reports that readings have been fluctuating 121'F-758'I systolic and 32'P diastolic.   498/26- most recent BP at chiropractor. Patient is asymptomatic at this time. Advised patient to continue checking and logging BP daily. Patient will bring this log to visit on 02/04/2021. Return/ED precautions given.   Talbot Grumbling, RN

## 2021-02-04 ENCOUNTER — Ambulatory Visit: Payer: BC Managed Care – PPO | Admitting: Family Medicine

## 2021-02-04 NOTE — Progress Notes (Deleted)
° ° °  SUBJECTIVE:   CHIEF COMPLAINT / HPI:   Presents for follow up for  elevated BP. Seen in clinic on 01/05/21, recommended lifestyle changes and BP monitoring at home.  Labs reviewed and significant for mild hypernatremia.  Since then patient reports improvement in symptoms **. Associated symptoms include **.   PERTINENT  PMH / PSH:  HTN HLD  OBJECTIVE:   There were no vitals taken for this visit.   General: Alert, no acute distress Cardio: Normal S1 and S2, RRR, no r/m/g Pulm: CTAB, normal work of breathing Abdomen: Bowel sounds normal. Abdomen soft and non-tender.  Extremities: No peripheral edema.  Neuro: Cranial nerves grossly intact   ASSESSMENT/PLAN:   No problem-specific Assessment & Plan notes found for this encounter.     Carollee Leitz, MD [place]

## 2021-02-04 NOTE — Patient Instructions (Incomplete)
Thank you for coming to see me today. It was a pleasure.   Will add Losartan 25 mg daily Labs today to check Kidney function  Please return in one week for repeat blood work only  Please follow-up with 4 weeks  If you have any questions or concerns, please do not hesitate to call the office at [phone].  Best,   Carollee Leitz, MD

## 2021-02-10 NOTE — Progress Notes (Deleted)
err

## 2021-02-11 ENCOUNTER — Ambulatory Visit: Payer: BC Managed Care – PPO | Admitting: Family Medicine

## 2021-03-09 ENCOUNTER — Ambulatory Visit: Payer: BC Managed Care – PPO | Admitting: Family Medicine

## 2021-03-29 NOTE — Patient Instructions (Incomplete)
Thank you for coming to see me today. It was a pleasure. Today we talked about:   ***  Please follow-up with *** in ***  If you have any questions or concerns, please do not hesitate to call the office at [phone].  Best,   Carollee Leitz, MD

## 2021-04-03 ENCOUNTER — Ambulatory Visit (INDEPENDENT_AMBULATORY_CARE_PROVIDER_SITE_OTHER): Payer: BC Managed Care – PPO | Admitting: Family Medicine

## 2021-04-03 ENCOUNTER — Other Ambulatory Visit: Payer: Self-pay

## 2021-04-03 ENCOUNTER — Telehealth: Payer: Self-pay

## 2021-04-03 DIAGNOSIS — Z91199 Patient's noncompliance with other medical treatment and regimen due to unspecified reason: Secondary | ICD-10-CM

## 2021-04-03 MED ORDER — CHLORTHALIDONE 25 MG PO TABS: ORAL_TABLET | ORAL | 0 refills | Status: DC

## 2021-04-03 NOTE — Telephone Encounter (Signed)
Patient calls nurse line requesting DMV handicap placard application to be completed by PCP.  ? ?Form placed in provider box for completion.  ? ?Talbot Grumbling, RN ? ?

## 2021-04-03 NOTE — Progress Notes (Deleted)
Appointment cancelled by patient.

## 2021-04-03 NOTE — Telephone Encounter (Signed)
Patient needs to schedule appointment with any provider to evaluate for [facility] forms as I do not see any documentation that meets criteria. ? ?Thank you ?

## 2021-04-05 ENCOUNTER — Other Ambulatory Visit: Payer: Self-pay | Admitting: Family Medicine

## 2021-04-06 NOTE — Telephone Encounter (Signed)
LVM for pt to call office back to inform her of below and to assist her in getting that appointment scheduled. If she calls back please give her this information and assist in getting her an appointment.Olivene Cookston Zimmerman Rumple, CMA ? ?

## 2021-04-07 NOTE — Telephone Encounter (Signed)
Pt returns call, was informed and appt made.  Christen Bame, CMA ? ?

## 2021-04-10 ENCOUNTER — Encounter: Payer: Self-pay | Admitting: Family Medicine

## 2021-04-10 NOTE — Progress Notes (Signed)
err

## 2021-04-20 ENCOUNTER — Ambulatory Visit (INDEPENDENT_AMBULATORY_CARE_PROVIDER_SITE_OTHER): Payer: BC Managed Care – PPO | Admitting: Family Medicine

## 2021-04-20 ENCOUNTER — Encounter: Payer: Self-pay | Admitting: Family Medicine

## 2021-04-20 VITALS — BP 141/86 | HR 82 | Ht 68.0 in | Wt 318.6 lb

## 2021-04-20 DIAGNOSIS — I10 Essential (primary) hypertension: Secondary | ICD-10-CM | POA: Diagnosis not present

## 2021-04-20 DIAGNOSIS — G629 Polyneuropathy, unspecified: Secondary | ICD-10-CM

## 2021-04-20 MED ORDER — CARVEDILOL 12.5 MG PO TABS: 12.5000 mg | ORAL_TABLET | Freq: Two times a day (BID) | ORAL | 3 refills | Status: DC

## 2021-04-20 NOTE — Progress Notes (Signed)
? ? ?  SUBJECTIVE:  ? ?CHIEF COMPLAINT / HPI: needs forms completed/neuropathy ? ?Patient requesting DMV placard form completion.  She has significant peripheral neuropathy, taking Gabapentin that would meet this qualification. ? ?PERTINENT  PMH / PSH:  ?HTN ?Peripheral Neuropathy ? ?OBJECTIVE:  ? ?BP (!) 141/86   Pulse 82   Ht '5\' 8"'$  (1.727 m)   Wt (!) 318 lb 9.6 oz (144.5 kg)   SpO2 100%   BMI 48.44 kg/m?   ? ?General: Alert, no acute distress ?Cardio: Normal S1 and S2, RRR, no r/m/g ?Pulm: CTAB, normal work of breathing ? ?ASSESSMENT/PLAN:  ? ?Peripheral neuropathy ?Continue current medication ?DMV placcard form completed and returned to patient ? ?HYPERTENSION, BENIGN ESSENTIAL ?Continues to be elevated despite compliancy with current medication. ?Increase Carvedilol from 6.25 mg to 12.5 mg twice daily ?Continue to monitor BP at home, bring results in at next visit ?Strict return precautions provided ?Follow up in 2 weeks ?  ? ? ?Carollee Leitz, MD ?[place]  ?

## 2021-04-20 NOTE — Patient Instructions (Addendum)
Thank you for coming to see me today. It was a pleasure.  ? ?Increase your blood pressure medication. Take 2 of the 6.25 mg tablets 2 times a day. Once you have almost got this gone pick up the new prescription.  You will then take 1 tablet 1 time a day. ? ?Continue to check you blood pressure at home 2-3 times a week. ? ?If you get weak, dizzy or have any shortness of breath or chest pain go to the emergency room right away. ? ?Please follow-up with PCP in 2 weeks ? ?If you have any questions or concerns, please do not hesitate to call the office at [phone]. ? ?Best,  ? ?Carollee Leitz, MD   ?

## 2021-04-22 ENCOUNTER — Encounter: Payer: Self-pay | Admitting: Family Medicine

## 2021-04-22 NOTE — Assessment & Plan Note (Signed)
Continues to be elevated despite compliancy with current medication. ?Increase Carvedilol from 6.25 mg to 12.5 mg twice daily ?Continue to monitor BP at home, bring results in at next visit ?Strict return precautions provided ?Follow up in 2 weeks ?

## 2021-04-22 NOTE — Assessment & Plan Note (Signed)
Continue current medication ?DMV placcard form completed and returned to patient ?

## 2021-04-29 ENCOUNTER — Ambulatory Visit (INDEPENDENT_AMBULATORY_CARE_PROVIDER_SITE_OTHER): Payer: BC Managed Care – PPO | Admitting: Family Medicine

## 2021-04-29 ENCOUNTER — Ambulatory Visit: Payer: BC Managed Care – PPO | Admitting: Family Medicine

## 2021-04-29 ENCOUNTER — Encounter: Payer: Self-pay | Admitting: Family Medicine

## 2021-04-29 VITALS — BP 132/84 | HR 83 | Ht 68.0 in | Wt 313.0 lb

## 2021-04-29 DIAGNOSIS — G4730 Sleep apnea, unspecified: Secondary | ICD-10-CM

## 2021-04-29 DIAGNOSIS — I10 Essential (primary) hypertension: Secondary | ICD-10-CM

## 2021-04-29 DIAGNOSIS — Z1211 Encounter for screening for malignant neoplasm of colon: Secondary | ICD-10-CM

## 2021-04-29 DIAGNOSIS — Z1231 Encounter for screening mammogram for malignant neoplasm of breast: Secondary | ICD-10-CM | POA: Diagnosis not present

## 2021-04-29 DIAGNOSIS — Z0000 Encounter for general adult medical examination without abnormal findings: Secondary | ICD-10-CM

## 2021-04-29 NOTE — Progress Notes (Signed)
? ? ?  SUBJECTIVE:  ? ?CHIEF COMPLAINT / HPI:  ? ?HTN- at last visit increased from Carvedilol 6.25 mg to 12.5 mg BID.  No side effects.  No headaches, vision changes, chest pain, orthostasis, dizziness.  Has previously tried amlodipine and had leg swelling.  Has an allergy to lisinopril.  Is also taking chlorthalidone 25 mg. ? ?Sleep problem-she notes her CPAP was recalled due to the tubing and so she has no longer been using this.  She wonders if this can contribute her blood pressure.  She has an appointment with her pulmonologist to get this reset up in a few weeks. ? ?Anemia-she has been taking iron and notes "was started many years ago."  She has never had a history of a colonoscopy or colon cancer.  She has previously been referred to GI.  She is willing to get colonoscopy was just worried about being put to sleep. ? ?Due for repeat mammogram.  Last one was in 2019 where she had a biopsy that showed fat necrosis. ? ?PERTINENT  PMH / PSH: HTN, peripheral neuropathy, restless leg syndrome, CPAP ? ?OBJECTIVE:  ? ?BP 132/84   Pulse 83   Ht '5\' 8"'$  (1.727 m)   Wt (!) 313 lb (142 kg)   SpO2 97%   BMI 47.59 kg/m?   ?General: A&O, NAD ?HEENT: No sign of trauma, EOM grossly intact ?Cardiac: RRR, no m/r/g ?Respiratory: CTAB, normal WOB, no w/c/r ?GI: non-distended ?Neuro: Normal gait, moves all four extremities appropriately. ?Psych: Appropriate mood and affect ? ? ?ASSESSMENT/PLAN:  ? ?HYPERTENSION, BENIGN ESSENTIAL ?- patient brings home log with BP ranging 140s-160s, occassional 242A, and diastolic 83-41D, she uses upper arm cuff, due to majority not being controlled will increase to Coreg 25 mg BID, continue to check home BP, follow up in 2 weeks ? ?Sleep apnea ?- not currently wearing CPAP, likely contributing to elevated BP, has f/u with pulmonology to replace CPAP ? ?Healthcare maintenance ?- discussed risk of colon cancer/polyps with her history of anemia, sent referral and given number to [facility] ?-  discussed need for repeat mammogram, ordered and given number to schedule ?- discussed Shingles vaccine, will get at pharmacy ?  ? ? ?Lenoria Chime, MD ?[place]  ? ?

## 2021-04-29 NOTE — Assessment & Plan Note (Signed)
-   patient brings home log with BP ranging 140s-160s, occassional 375O, and diastolic 36-06V, she uses upper arm cuff, due to majority not being controlled will increase to Coreg 25 mg BID, continue to check home BP, follow up in 2 weeks ?

## 2021-04-29 NOTE — Patient Instructions (Addendum)
It was wonderful to see you today. ? ?Please bring ALL of your medications with you to every visit.  ? ?Today we talked about: ? ?Increase your Coreg to '25mg'$  twice daily (4 of the 6.25 mg pills). Let us know when you are out of this medication and we can prescribe you 1 pill and the larger size.  Please continue to check your blood pressure daily and schedule a follow-up in 2 weeks. ? ?For your colonoscopy, I have referred you to [facility].  Their number is [phone] to call to schedule your colonoscopy.  This is important to work-up with your history of anemia. ? ?For your mammogram, I have ordered you a repeat screening mammogram.  You can call the breast [place] at [phone] to schedule a mammogram. ? ?You can get your shingles vaccine at the pharmacy.  It is a 2 shot series. ? ? ?Thank you for choosing [facility].  ? ?Please call [phone] with any questions about today's appointment. ? ?Please be sure to schedule follow up at the front  desk before you leave today.  ? ?Please arrive at least 15 minutes prior to your scheduled appointments. ?  ?If you had blood work today, I will send you a MyChart message or a letter if results are normal. Otherwise, I will give you a call. ?  ?If you had a referral placed, they will call you to set up an appointment. Please give [place] a call if you don't hear back in the next 2 weeks. ?  ?If you need additional refills before your next appointment, please call your pharmacy first.  ? ?Yehuda Savannah, MD  ?Family Medicine   ?

## 2021-04-29 NOTE — Assessment & Plan Note (Signed)
-   not currently wearing CPAP, likely contributing to elevated BP, has f/u with pulmonology to replace CPAP ?

## 2021-04-29 NOTE — Assessment & Plan Note (Addendum)
-   discussed risk of colon cancer/polyps with her history of anemia, sent referral and given number to [facility] ?- discussed need for repeat mammogram, ordered and given number to schedule ?- discussed Shingles vaccine, will get at pharmacy ?

## 2021-04-30 ENCOUNTER — Other Ambulatory Visit: Payer: Self-pay

## 2021-04-30 MED ORDER — CARVEDILOL 25 MG PO TABS: 25.0000 mg | ORAL_TABLET | Freq: Two times a day (BID) | ORAL | 3 refills | Status: DC

## 2021-04-30 NOTE — Telephone Encounter (Signed)
Patient calls nurse line reporting she was seen yesterday and told to increase Coreg to '25mg'$  twice daily.  ? ?Patient is requesting a refill reflecting change.  ? ?The prescription sent in on 4/3 was set to print and never received by pharmacy. Patient is requesting '25mg'$  tablets.  ? ?Will forward to provider who saw patient and PCP.  ? ? ?

## 2021-05-19 ENCOUNTER — Ambulatory Visit: Payer: BC Managed Care – PPO | Admitting: Family Medicine

## 2021-05-19 ENCOUNTER — Encounter: Payer: Self-pay | Admitting: Family Medicine

## 2021-05-19 DIAGNOSIS — I10 Essential (primary) hypertension: Secondary | ICD-10-CM

## 2021-05-19 DIAGNOSIS — Z6841 Body Mass Index (BMI) 40.0 and over, adult: Secondary | ICD-10-CM

## 2021-05-19 DIAGNOSIS — Z0000 Encounter for general adult medical examination without abnormal findings: Secondary | ICD-10-CM | POA: Diagnosis not present

## 2021-05-19 NOTE — Patient Instructions (Signed)
Thank you for coming to see me today. It was a pleasure. Today we talked about:  ? ?Continue current blood pressure medication ? ?Check blood pressure 2 to 3 times a week ? ?Please follow-up with PCP in 2 weeks ? ?If you have any questions or concerns, please do not hesitate to call the office at [phone]. ? ?Best,  ? ?Carollee Leitz, MD   ?

## 2021-05-19 NOTE — Progress Notes (Signed)
? ? ?  SUBJECTIVE:  ? ?CHIEF COMPLAINT / HPI: Blood pressure and discuss weight ? ?Reports blood pressure remains elevated at times since Carvedilol increased to 25 mg BID 2 weeks ago.  Noted that home pressures had few days of 160-180's but has also been as low as 120's-130's.  Continues to take Chlorthalidone 25 mg daily.  Denies any visual changes, chest pain, shortness of breath or lower extremity swelling. ? ?Weight gain ?Reports that she has not been as active since COVID and has regain weight.  She at one time lost 100 lbs and feels she can get back to this but would like some assistance.  She admits to comfort eating and thinks this may have contributed to some of this weight gain.  She is concerned that this is affecting her blood pressure. She reports now trying to watch her diet and drinking water. ? ?PERTINENT  PMH / PSH:  ?HTN ?Obesity class 3 ? ?OBJECTIVE:  ? ?BP 136/76   Pulse 74   Ht '5\' 8"'$  (1.727 m)   Wt (!) 325 lb (147.4 kg)   SpO2 98%   BMI 49.42 kg/m?   ? ?General: Alert, no acute distress ?Cardio: Normal S1 and S2, RRR, no r/m/g ?Pulm: CTAB, normal work of breathing ?Abdomen: Bowel sounds normal. Abdomen soft and non-tender.  ?Extremities: No lower extremity edema ? ?ASSESSMENT/PLAN:  ? ?HYPERTENSION, BENIGN ESSENTIAL ?Normotensive today. ?Continue current medications ?Check BP 2-3 times a week ?Follow up in 2 weeks and if remains elevated, will add Spironolactone ?Strict return precautions provided ? ?OBESITY, NOS ?Class 3 Obesity with hypertensive and hyperlipidemia ?Discussed importance of balanced nutrition  ?Declined formal nutrition referral ?No history of Medullary thyroid cancer, pancreatitis ?Will start Wegovy 0.25 mg weekly ?Follow up in 4 weeks ? ?Healthcare maintenance ?Awaiting appointment for Colonoscopy ?Mammogram and DEXA orders had previously been placed, discussed with patient to call and schedule appointment ?Recommend Shingles vaccine ?PNA vaccine UTD ?Last 2 PAP smear  results normal.  Given age and normal results she has now aged out of screening.  Discussed with patient and is agreeable to discontinuing current screening. ?  ? ? ?Carollee Leitz, MD ?[place]  ?

## 2021-05-21 ENCOUNTER — Institutional Professional Consult (permissible substitution): Payer: BC Managed Care – PPO | Admitting: Pulmonary Disease

## 2021-05-21 MED ORDER — SEMAGLUTIDE-WEIGHT MANAGEMENT 0.25 MG/0.5ML ~~LOC~~ SOAJ: 0.2500 mg | SUBCUTANEOUS | 0 refills | Status: DC

## 2021-05-23 ENCOUNTER — Encounter: Payer: Self-pay | Admitting: Family Medicine

## 2021-05-23 NOTE — Assessment & Plan Note (Signed)
Awaiting appointment for Colonoscopy ?Mammogram and DEXA orders had previously been placed, discussed with patient to call and schedule appointment ?Recommend Shingles vaccine ?PNA vaccine UTD ?Last 2 PAP smear results normal.  Given age and normal results she has now aged out of screening.  Discussed with patient and is agreeable to discontinuing current screening. ?

## 2021-05-23 NOTE — Assessment & Plan Note (Addendum)
Normotensive today. ?Continue current medications ?Check BP 2-3 times a week ?Follow up in 2 weeks and if remains elevated, will add Spironolactone ?Strict return precautions provided ?

## 2021-05-23 NOTE — Assessment & Plan Note (Addendum)
Class 3 Obesity with hypertensive and hyperlipidemia ?Discussed importance of balanced nutrition  ?Declined formal nutrition referral ?No history of Medullary thyroid cancer, pancreatitis ?Will start Wegovy 0.25 mg weekly ?Follow up in 4 weeks ?

## 2021-05-25 ENCOUNTER — Telehealth: Payer: Self-pay

## 2021-05-25 NOTE — Telephone Encounter (Signed)
A Prior Authorization was initiated for this patients WEGOVY through CoverMyMeds.  ? ?Key: [id number] ? ?

## 2021-05-26 ENCOUNTER — Other Ambulatory Visit (HOSPITAL_COMMUNITY): Payer: Self-pay

## 2021-05-26 NOTE — Telephone Encounter (Signed)
Prior Auth for patients medication WEGOVY denied by [facility] via CoverMyMeds.  ? ?Reason: APPEAL NEEDED ? ?CoverMyMeds Key:  [id number] ? ?Appeal submitted 05/26/21. Need to fax patient approval page with appeal. Patient gave me permission via phone call to sign on her behalf 05/26/21 9:40am.  ? ?

## 2021-05-28 ENCOUNTER — Ambulatory Visit (INDEPENDENT_AMBULATORY_CARE_PROVIDER_SITE_OTHER): Payer: BC Managed Care – PPO | Admitting: Pulmonary Disease

## 2021-05-28 ENCOUNTER — Encounter: Payer: Self-pay | Admitting: Pulmonary Disease

## 2021-05-28 VITALS — BP 136/84 | HR 66 | Temp 98.1°F | Ht 68.0 in | Wt 325.0 lb

## 2021-05-28 DIAGNOSIS — G4733 Obstructive sleep apnea (adult) (pediatric): Secondary | ICD-10-CM

## 2021-05-28 NOTE — Patient Instructions (Signed)
Prescription for CPAP to DME ? ?CPAP of 17 with heated humidification ? ?If needs a new study, schedule for in lab split-night study ? ?Tentative follow-up 3 to 4 months or a month after initiation of new therapy ? ?Continue weight loss efforts ? ?Call with significant concerns ? ? ?

## 2021-05-28 NOTE — Progress Notes (Signed)
? ?Subjective:  ? ? Patient ID: Regina Burns, female    DOB: 1955/04/10, 66 y.o.   MRN: [id number] ?Chief complaint: ?History of moderate obstructive sleep apnea, was on CPAP therapy until machine was recalled ?Has not used CPAP for the last 6 to 7 months ? ? ?HPI ?Was diagnosed with moderate obstructive sleep apnea ?Was using CPAP regularly ?She did get used to using CPAP nightly ? ?Was not having any significant difficulty but stopped using CPAP with recall ? ?She has since gained some weight back some sleepiness during the day ? ?Usually goes to bed between 9 and 10 PM ?About 30 minutes to fall asleep ?3-4 awakenings ?Final wake up time about 3 AM ? ?She is a International aid/development worker ? ?She is usually not sleepy during the day ? ?She felt CPAP was helping prior to it being discontinued ? ?Reformed smoker ?Able to concentrate well ?No history of heart disease ? ?Past Medical History:  ?Diagnosis Date  ?? GERD (gastroesophageal reflux disease) 2005  ?? Hyperlipidemia 2012  ?? Hypertension 2012  ? ?Social History  ? ?Tobacco Use  ?? Smoking status: Former  ?  Types: Cigarettes  ?  Quit date: 01/18/1998  ?  Years since quitting: 23.3  ?? Smokeless tobacco: Never  ?Substance Use Topics  ?? Alcohol use: No  ?? Drug use: No  ?  Frequency: 3.0 times per week  ? ?Family History  ?Problem Relation Age of Onset  ?? Alcohol abuse Mother   ?? Alzheimer's disease Mother   ?? Heart disease Mother   ?? Diabetes Mother   ?? Hypertension Mother   ?? Stroke Mother   ?? Heart disease Father   ?? Diabetes Father   ?? Hyperlipidemia Father   ?? Hypertension Father   ?? Alzheimer's disease Maternal Aunt   ?? Hypertension Paternal Aunt   ?? Hypertension Paternal Uncle   ?? Hypertension Maternal Grandmother   ?? Breast cancer Neg Hx   ? ?Review of Systems  ?Constitutional: Negative.   ?HENT: Negative.    ?Eyes: Negative.   ?Respiratory:  Positive for apnea.   ?Cardiovascular: Negative.   ?Gastrointestinal: Negative.    ?Psychiatric/Behavioral:  Positive for sleep disturbance.   ?Vitals:  ? 05/28/21 1032  ?BP: 136/84  ?Pulse: 66  ?Temp: 98.1 ?F (36.7 ?C)  ?SpO2: 100%  ? ?   ?Objective:  ? Physical Exam ?Constitutional:   ?   General: She is not in acute distress. ?   Appearance: She is well-developed. She is not diaphoretic.  ?HENT:  ?   Head: Normocephalic and atraumatic.  ?   Nose: Nose normal.  ?Eyes:  ?   General:     ?   Right eye: No discharge.     ?   Left eye: No discharge.  ?   Pupils: Pupils are equal, round, and reactive to light.  ?Neck:  ?   Trachea: No tracheal deviation.  ?Cardiovascular:  ?   Rate and Rhythm: Normal rate and regular rhythm.  ?   Heart sounds: No murmur heard. ?Pulmonary:  ?   Effort: Pulmonary effort is normal. No respiratory distress.  ?   Breath sounds: Normal breath sounds. No stridor. No wheezing, rhonchi or rales.  ?Musculoskeletal:  ?   Cervical back: No rigidity or tenderness.  ?Neurological:  ?   Mental Status: She is alert.  ?Psychiatric:     ?   Mood and Affect: Mood normal.  ?  ? ?Sleep study in 2019 with  moderate obstructive sleep apnea ?Compliance download during previous visits did show adequate treatment with CPAP of 17 ? ?   ?Assessment & Plan:  ? ?Moderately severe obstructive sleep apnea with oxygen desaturations ?-Was adequately treated with CPAP therapy ?-Was compliant with CPAP use until machine got recalled ? ?.  Class III obesity ?-importance of keeping a weight in check was discussed ? ?.  No underlying lung disease diagnosed ? ? ?Plan. ?We will provide a prescription for CPAP of 17 to go to DME company ? ?Encouraged to reinitiate CPAP use ? ?May need repeat in lab sleep study-split-night study because of break in therapy ? ?Importance of keeping the weight in check discussed ? ?Tentative follow-up in 3 to 4 months ?

## 2021-05-29 ENCOUNTER — Ambulatory Visit: Payer: BC Managed Care – PPO

## 2021-05-29 VITALS — BP 138/70 | HR 66

## 2021-05-29 DIAGNOSIS — I10 Essential (primary) hypertension: Secondary | ICD-10-CM

## 2021-05-29 NOTE — Progress Notes (Signed)
Patient here today for BP check.     ? ?Last BP was on 05/19/2021 and was 136/76. ? ?BP at Pulmonologist 05/28/2021 was 136/84. ? ?BP today is 138/70 with a pulse of 66.   ? ?Checked BP in left arm with large cuff.   ? ?Symptoms present: None.  ? ?Patient last took BP med Carvedilol ~4am this morning.   ? ?Patient has an already scheduled apt with PCP on 5/22. ? ? ? ?

## 2021-06-02 ENCOUNTER — Encounter: Payer: Self-pay | Admitting: Pulmonary Disease

## 2021-06-02 NOTE — Telephone Encounter (Signed)
Prior Auth APPEAL for patients medication WEGOVY denied by [facility] via CoverMyMeds.  ? ?Reason: coverage provided when medication is used with a reduced calorie diet & increased physical activity for chronic weight management, AND pt has participated in a weight management program with continuing follow-up for at least 6 months prior to using drug therapy, AND BMI greater than or equal to 30kg, OR pt has BMI greater than or equal to 27kg AND has at least one eight related comorbid condition (hypertention, type 2 diabetes, dyslipidemia). ? ?RESENDING APPEAL, PT HAS BMI GREATER THAN 27KG AND HAS HYPERTENSTION ? ? ?

## 2021-06-04 ENCOUNTER — Other Ambulatory Visit: Payer: Self-pay

## 2021-06-04 ENCOUNTER — Emergency Department (HOSPITAL_BASED_OUTPATIENT_CLINIC_OR_DEPARTMENT_OTHER)
Admission: EM | Admit: 2021-06-04 | Discharge: 2021-06-04 | Disposition: A | Discharge status: Home / Self Care | Payer: BC Managed Care – PPO | Attending: Emergency Medicine | Admitting: Emergency Medicine

## 2021-06-04 DIAGNOSIS — M25522 Pain in left elbow: Secondary | ICD-10-CM | POA: Diagnosis present

## 2021-06-04 DIAGNOSIS — Z79899 Other long term (current) drug therapy: Secondary | ICD-10-CM | POA: Insufficient documentation

## 2021-06-04 DIAGNOSIS — I10 Essential (primary) hypertension: Secondary | ICD-10-CM | POA: Insufficient documentation

## 2021-06-04 DIAGNOSIS — M7022 Olecranon bursitis, left elbow: Secondary | ICD-10-CM

## 2021-06-04 DIAGNOSIS — Z7982 Long term (current) use of aspirin: Secondary | ICD-10-CM | POA: Diagnosis not present

## 2021-06-04 NOTE — Discharge Instructions (Addendum)
You came to the ED today to be evaluated for your left elbow swelling.  Your physical exam was consistent with elbow bursitis.  Please take Tylenol and ibuprofen as indicated below.  Additionally you may apply ice for 20 minutes at a time and then give yourself a 20-minute rest period.  If your swelling does not improve please follow-up with the orthopedic provider listed on this paperwork.  Please take Ibuprofen (Advil, motrin) and Tylenol (acetaminophen) to relieve your pain.    You may take up to 600 MG (3 pills) of normal strength ibuprofen every 8 hours as needed.   You make take tylenol, up to 1,000 mg (two extra strength pills) every 8 hours as needed.   It is safe to take ibuprofen and tylenol at the same time as they work differently.   Do not take more than 3,000 mg tylenol in a 24 hour period (not more than one dose every 8 hours.  Please check all medication labels as many medications such as pain and cold medications may contain tylenol.  Do not drink alcohol while taking these medications.  Do not take other NSAID'S while taking ibuprofen (such as aleve or naproxen).  Please take ibuprofen with food to decrease stomach upset.  Get help right away if: You have trouble moving your arm, hand, or fingers. You develop redness to your elbow You cannot bend your elbow

## 2021-06-04 NOTE — ED Triage Notes (Signed)
Pt c/o left elbow swelling started yesterday. Denies injury, no fever. Pt has full range of motion, carried purse with same arm.

## 2021-06-04 NOTE — ED Provider Notes (Signed)
[place] [facility] EMERGENCY DEPARTMENT Provider Note   CSN: [id number] Arrival date & time: 06/04/21  0843     History  Chief Complaint  Patient presents with   Elbow Pain    Regina Burns is a 66 y.o. female with a history of hypertension, hyperlipidemia, GERD.  Presents to the emergency department with a chief complaint of swelling to her left elbow.  Patient reports that yesterday she started having some pain to her left elbow.  Pain has gradually improved.  Patient reports that she woke up this morning and noticed swelling to her left elbow.  Patient denies any known injury.  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  Patient is right-hand dominant.  Patient denies any numbness, weakness, color change, pallor, rash, wound, fever, chills, IV drug use, immunocompromised state.  HPI     Home Medications Prior to Admission medications   Medication Sig Start Date End Date Taking? Authorizing Provider  aspirin EC 81 MG tablet Take 81 mg by mouth daily. Swallow whole.    [provider]  carvedilol (COREG) 25 MG tablet Take 1 tablet (25 mg total) by mouth 2 (two) times daily with a meal. 04/30/21   Pray, Norwood Levo, MD  chlorthalidone (HYGROTON) 25 MG tablet TAKE 1 TABLET(25 MG) BY MOUTH DAILY 04/03/21   Carollee Leitz, MD  cycloSPORINE (RESTASIS) 0.05 % ophthalmic emulsion Place 1 drop into both eyes 2 (two) times daily. 10/11/20   [provider]  Ferrous Sulfate (IRON PO) Take 1 tablet by mouth daily.    [provider]  gabapentin (NEURONTIN) 300 MG capsule TAKE 1 CAPSULE(300 MG) BY MOUTH AT BEDTIME 04/06/21   Carollee Leitz, MD  lovastatin (MEVACOR) 40 MG tablet TAKE 1 TABLET BY MOUTH AT BEDTIME 01/05/21   Carollee Leitz, MD  Semaglutide-Weight Management 0.25 MG/0.5ML SOAJ Inject 0.25 mg into the skin once a week. 05/21/21   Carollee Leitz, MD      Allergies    Ace inhibitors, Lisinopril,  Clindamycin/lincomycin, and Contrast media [iodinated contrast media]    Review of Systems   Review of Systems  Constitutional:  Negative for chills and fever.  Musculoskeletal:  Positive for arthralgias and joint swelling.  Skin:  Negative for color change, pallor, rash and wound.  Allergic/Immunologic: Negative for immunocompromised state.  Neurological:  Negative for weakness and numbness.   Physical Exam Updated Vital Signs BP (!) 153/77 (BP Location: Right Arm)   Pulse 65   Temp 98 F (36.7 C) (Oral)   Resp 16   Ht '5\' 7"'$  (1.702 m)   Wt (!) 145.2 kg   SpO2 97%   BMI 50.12 kg/m  Physical Exam Vitals and nursing note reviewed.  Constitutional:      General: She is not in acute distress.    Appearance: She is not ill-appearing, toxic-appearing or diaphoretic.  HENT:     Head: Normocephalic.  Eyes:     General: No scleral icterus.       Right eye: No discharge.        Left eye: No discharge.  Cardiovascular:     Rate and Rhythm: Normal rate.     Pulses:          Radial pulses are 2+ on the right side and 2+ on the left side.  Pulmonary:     Effort: Pulmonary effort is normal.  Musculoskeletal:     Left upper arm: Normal.  Left elbow: Swelling present. No deformity, effusion or lacerations. Normal range of motion. No tenderness.     Left forearm: Normal.     Left wrist: Normal.     Right hand: No swelling, deformity, lacerations, tenderness or bony tenderness. Normal range of motion. Normal sensation. Normal capillary refill. Normal pulse.     Left hand: No swelling, deformity, lacerations, tenderness or bony tenderness. Normal range of motion. Normal sensation. Normal capillary refill. Normal pulse.     Comments: Swelling to olecranon bursa with minimal tenderness.  No surrounding rash, erythema, warmth, or wound.  Full active range of motion to left elbow without difficulty or complaints of pain.  Skin:    General: Skin is warm and dry.  Neurological:      General: No focal deficit present.     Mental Status: She is alert.  Psychiatric:        Behavior: Behavior is cooperative.    ED Results / Procedures / Treatments   Labs (all labs ordered are listed, but only abnormal results are displayed) Labs Reviewed - No data to display  EKG None  Radiology No results found.  Procedures Procedures    Medications Ordered in ED Medications - No data to display  ED Course/ Medical Decision Making/ A&P                           Medical Decision Making  Alert 66 year old female no acute distress, nontoxic-appearing.  Presents to the emergency department with a chief complaint of swelling to left elbow.    Information obtained from patient.  Past medical records were reviewed including previous provider notes, labs, and imaging.  Patient has medical history as outlined in HPI which complicates her care.  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  Suspect acute bursitis.    X-ray imaging was considered to look for acute osseous abnormality however with no recent trauma low suspicion for acute osseous abnormality at this time.  Septic arthritis/bursitis was considered however low suspicion at this time as patient has no erythema or warmth to affected joint.  Patient also has full range of motion to affected joint without complaints of pain and is afebrile.  Discussed symptomatic treatment with patient.  Patient given information to follow-up with orthopedic provider swelling does not improve.  Based on patient's chief complaint, I considered admission might be necessary, however after reassuring ED workup feel patient is reasonable for discharge.  Discussed results, findings, treatment and follow up. Patient advised of return precautions. Patient verbalized understanding and agreed with plan.  Portions of this note were generated with [employer].  Dictation errors may occur despite best attempts at proofreading.         Final Clinical Impression(s) / ED Diagnoses Final diagnoses:  None    Rx / DC Orders ED Discharge Orders     None         Loni Beckwith, PA-C 67/34/19 3790    Lianne Cure, DO 24/09/73 1326

## 2021-06-08 ENCOUNTER — Other Ambulatory Visit (HOSPITAL_COMMUNITY): Payer: Self-pay

## 2021-06-08 ENCOUNTER — Ambulatory Visit: Payer: BC Managed Care – PPO | Admitting: Family Medicine

## 2021-06-08 NOTE — Progress Notes (Deleted)
    SUBJECTIVE:   CHIEF COMPLAINT / HPI: Follow-up blood pressure  Presents for follow up for elevated blood pressure. Seen in clinic on 05/02 and patient wished to remain on current medications at the time as she had only been on the increased dose of carvedilol for 1 week.  Collaborative decision to remain on carvedilol 25 twice daily along with her chlorthalidone 25 mg daily.  Since then patient reports improvement in symptoms **. Associated symptoms include **.   PERTINENT  PMH / PSH: ***  OBJECTIVE:   There were no vitals taken for this visit.   General: Alert, no acute distress Cardio: Normal S1 and S2, RRR, no r/m/g Pulm: CTAB, normal work of breathing Abdomen: Bowel sounds normal. Abdomen soft and non-tender.  Extremities: No peripheral edema.  Neuro: Cranial nerves grossly intact   ASSESSMENT/PLAN:   No problem-specific Assessment & Plan notes found for this encounter.     Carollee Leitz, MD [place]

## 2021-06-08 NOTE — Patient Instructions (Incomplete)
Thank you for coming to see me today. It was a pleasure.   We will get some labs today.  If they are abnormal or we need to do something about them, I will call you.  If they are normal, I will send you a message on MyChart (if it is active) or a letter in the mail.  If you don't hear from [place] in 2 weeks, please call the office at the number below.   We will add spironolactone 25 mg daily to help with your blood pressure  You are due for a colonoscopy.  Please use the form that we have given you to schedule this at your convenience.    Recommend Shingles vaccine.  This is a 2 dose series and can be given at your local pharmacy.  Please talk to your pharmacist about this.   Please follow-up with PCP in 4 weeks  If you have any questions or concerns, please do not hesitate to call the office at [phone].  Best,   Carollee Leitz, MD

## 2021-06-18 ENCOUNTER — Other Ambulatory Visit (HOSPITAL_COMMUNITY): Payer: Self-pay

## 2021-06-22 NOTE — Telephone Encounter (Signed)
Prior Auth for patients medication WEGOVY denied by [facility] via CoverMyMeds.   Reason: Same as previously stated: Coverage provided when medication is used with a reduced calorie diet & increased physical activity for chronic weight management, AND pt has participated in a weight management program with continuing follow-up for at least 6 months prior to using drug therapy.   Denial letter scanned to chart with decision  CoverMyMeds Key: [id number]

## 2021-06-23 ENCOUNTER — Encounter: Payer: Self-pay | Admitting: *Deleted

## 2021-06-29 ENCOUNTER — Other Ambulatory Visit: Payer: BC Managed Care – PPO

## 2021-07-10 ENCOUNTER — Other Ambulatory Visit: Payer: Self-pay

## 2021-07-13 MED ORDER — CHLORTHALIDONE 25 MG PO TABS: ORAL_TABLET | ORAL | 0 refills | Status: DC

## 2021-08-06 ENCOUNTER — Inpatient Hospital Stay: Admission: RE | Admit: 2021-08-06 | Payer: BC Managed Care – PPO | Source: Ambulatory Visit

## 2021-08-10 ENCOUNTER — Telehealth: Payer: Self-pay | Admitting: Pulmonary Disease

## 2021-08-10 NOTE — Telephone Encounter (Signed)
Please call this patient to make a follow up with a NP for a compliance check between 08/11/2021 and 08/27/2021. Thanks

## 2021-08-11 NOTE — Telephone Encounter (Signed)
Pt scheduled for visit on 8/8 with Tammy.

## 2021-08-25 ENCOUNTER — Ambulatory Visit (INDEPENDENT_AMBULATORY_CARE_PROVIDER_SITE_OTHER): Payer: BC Managed Care – PPO | Admitting: Adult Health

## 2021-08-25 ENCOUNTER — Encounter: Payer: Self-pay | Admitting: Adult Health

## 2021-08-25 DIAGNOSIS — Z6841 Body Mass Index (BMI) 40.0 and over, adult: Secondary | ICD-10-CM

## 2021-08-25 DIAGNOSIS — G4730 Sleep apnea, unspecified: Secondary | ICD-10-CM

## 2021-08-25 NOTE — Assessment & Plan Note (Signed)
Moderate obstructive sleep apnea with good control and compliance on CPAP.  Patient is encouraged to wear CPAP every single night.  Try to get in more than 6 hours of sleep each night. Patient education given on CPAP and sleep apnea  Plan  Patient Instructions  Wear CPAP all night long , get 6 hr or more of sleep  Work on healthy weight loss Do not drive if sleepy  Work on healthy weight.  Follow up in 6 months with Dr. Ander Slade or Montrelle Eddings NP and As needed

## 2021-08-25 NOTE — Assessment & Plan Note (Signed)
Healthy weight loss discussed 

## 2021-08-25 NOTE — Patient Instructions (Addendum)
Wear CPAP all night long , get 6 hr or more of sleep  Work on healthy weight loss Do not drive if sleepy  Work on healthy weight.  Follow up in 6 months with Dr. Ander Slade or Rayven Rettig NP and As needed

## 2021-08-25 NOTE — Progress Notes (Signed)
$'@Patient'c$  ID: Regina Burns, female    DOB: 19-Apr-1955, 66 y.o.   MRN: [id number]  Chief Complaint  Patient presents with   Follow-up    Referring provider: Wells Guiles, DO  HPI: 66 year old female seen followed for sleep apnea [employer], [employer] license  TEST/EVENTS :  Sleep study 2019 moderate sleep apnea  08/25/2021 Follow up : OSA  Patient presents for a 52-monthfollow-up.  Patient has underlying moderate sleep apnea.  She is on nocturnal CPAP.  Patient says that she has tried to wear her CPAP every single night.  Occasionally falls asleep and does not put on her CPAP but for the most part is wearing her CPAP most of the night.  Patient says she does feel like it is helping her sleep she is not as restless and does not feel as tired during the daytime.  Feels that she benefits from CPAP.  CPAP download shows 77% compliance.  Daily average usage at 5.5 hours.  Patient is on CPAP 17 cm H2O.  AHI is 2.6/hour.  She is currently using a fullface mask.  Patient says she did try couple mass before this but her current mask is working very well.    Allergies  Allergen Reactions   Ace Inhibitors Swelling   Lisinopril Swelling    Was hospitalized    Clindamycin/Lincomycin Hives   Contrast Media [Iodinated Contrast Media] Hives    Immunization History  Administered Date(s) Administered   Influenza,inj,Quad PF,6+ Mos 10/24/2014, 12/29/2015, 10/27/2016, 09/29/2017, 10/11/2018   Influenza-Unspecified 10/18/2020   Moderna Covid-19 Vaccine Bivalent Booster 18yr& up 02/06/2021   Moderna Sars-Covid-2 Vaccination 04/07/2019, 05/08/2019, 12/03/2019, 07/10/2020   PNEUMOCOCCAL CONJUGATE-20 01/05/2021   Td 05/19/2002   Tdap 12/29/2015    Past Medical History:  Diagnosis Date   GERD (gastroesophageal reflux disease) 2005   Hyperlipidemia 2012   Hypertension 2012    Tobacco History: Social History   Tobacco Use  Smoking Status Former   Types: Cigarettes   Quit  date: 01/18/1998   Years since quitting: 23.6  Smokeless Tobacco Never   Counseling given: Not Answered   Outpatient Medications Prior to Visit  Medication Sig Dispense Refill   aspirin EC 81 MG tablet Take 81 mg by mouth daily. Swallow whole.     carvedilol (COREG) 25 MG tablet Take 1 tablet (25 mg total) by mouth 2 (two) times daily with a meal. 180 tablet 3   chlorthalidone (HYGROTON) 25 MG tablet TAKE 1 TABLET(25 MG) BY MOUTH DAILY 90 tablet 0   cycloSPORINE (RESTASIS) 0.05 % ophthalmic emulsion Place 1 drop into both eyes 2 (two) times daily.     Ferrous Sulfate (IRON PO) Take 1 tablet by mouth daily.     gabapentin (NEURONTIN) 300 MG capsule TAKE 1 CAPSULE(300 MG) BY MOUTH AT BEDTIME 30 capsule 3   lovastatin (MEVACOR) 40 MG tablet TAKE 1 TABLET BY MOUTH AT BEDTIME 90 tablet 3   Semaglutide-Weight Management 0.25 MG/0.5ML SOAJ Inject 0.25 mg into the skin once a week. 2 mL 0   No facility-administered medications prior to visit.     Review of Systems:   Constitutional:   No  weight loss, night sweats,  Fevers, chills, fatigue, or  lassitude.  HEENT:   No headaches,  Difficulty swallowing,  Tooth/dental problems, or  Sore throat,                No sneezing, itching, ear ache, nasal congestion, post nasal drip,   CV:  No  chest pain,  Orthopnea, PND, swelling in lower extremities, anasarca, dizziness, palpitations, syncope.   GI  No heartburn, indigestion, abdominal pain, nausea, vomiting, diarrhea, change in bowel habits, loss of appetite, bloody stools.   Resp: No shortness of breath with exertion or at rest.  No excess mucus, no productive cough,  No non-productive cough,  No coughing up of blood.  No change in color of mucus.  No wheezing.  No chest wall deformity  Skin: no rash or lesions.  GU: no dysuria, change in color of urine, no urgency or frequency.  No flank pain, no hematuria   MS:  No joint pain or swelling.  No decreased range of motion.  No back  pain.    Physical Exam  BP 120/80 (BP Location: Left Arm)   Pulse 75   Temp 97.8 F (36.6 C) (Oral)   Ht '5\' 8"'$  (1.727 m)   Wt (!) 334 lb (151.5 kg)   SpO2 97%   BMI 50.78 kg/m   GEN: A/Ox3; pleasant , NAD, well nourished    HEENT:  [place]/AT,  NOSE-clear, THROAT-clear, no lesions, no postnasal drip or exudate noted.  Class 3 MP airway   NECK:  Supple w/ fair ROM; no JVD; normal carotid impulses w/o bruits; no thyromegaly or nodules palpated; no lymphadenopathy.    RESP  Clear  P & A; w/o, wheezes/ rales/ or rhonchi. no accessory muscle use, no dullness to percussion  CARD:  RRR, no m/r/g, no peripheral edema, pulses intact, no cyanosis or clubbing.  GI:   Soft & nt; nml bowel sounds; no organomegaly or masses detected.   Musco: Warm bil, no deformities or joint swelling noted.   Neuro: alert, no focal deficits noted.    Skin: Warm, no lesions or rashes    Lab Results:     ProBNP No results found for: "PROBNP"  Imaging: No results found.        No data to display          No results found for: "NITRICOXIDE"      Assessment & Plan:   Sleep apnea Moderate obstructive sleep apnea with good control and compliance on CPAP.  Patient is encouraged to wear CPAP every single night.  Try to get in more than 6 hours of sleep each night. Patient education given on CPAP and sleep apnea  Plan  Patient Instructions  Wear CPAP all night long , get 6 hr or more of sleep  Work on healthy weight loss Do not drive if sleepy  Work on healthy weight.  Follow up in 6 months with Dr. Ander Slade or Kashlynn Kundert NP and As needed        OBESITY, NOS Healthy weight loss discussed    Rexene Edison, NP 08/25/2021

## 2021-08-27 ENCOUNTER — Other Ambulatory Visit: Payer: Self-pay | Admitting: Family Medicine

## 2021-09-10 NOTE — Progress Notes (Deleted)
  SUBJECTIVE:   CHIEF COMPLAINT / HPI:   Hypertension:   today. Home medications include: Coreg 25 mg twice daily, chlorthalidone 25 mg daily, lovastatin 40 mg daily. She {Blank single:19197::"endorses","does not endorse"} taking these medications as prescribed. {Blank single:19197::"Does","Does not"} check blood pressure at home.*** Diet ***. Exercise ***. Most recent creatinine trend:  Lab Results  Component Value Date   CREATININE 0.91 01/05/2021   CREATININE 0.88 10/24/2020   CREATININE 1.02 (H) 04/18/2019   Patient {HAS HAS [id number]} had a BMP in the past 1 year.  PERTINENT  PMH / PSH: HTN, obesity, GERD, HLD, OSA  OBJECTIVE:  There were no vitals taken for this visit.  General: NAD, pleasant, able to participate in exam Cardiac: RRR, no murmurs auscultated Respiratory: CTAB, normal WOB Abdomen: soft, non-tender, non-distended, normoactive bowel sounds Extremities: warm and well perfused, no edema or cyanosis Skin: warm and dry, no rashes noted Neuro: alert, no obvious focal deficits, speech normal Psych: Normal affect and mood  ASSESSMENT/PLAN:  No problem-specific Assessment & Plan notes found for this encounter.   No orders of the defined types were placed in this encounter.  No orders of the defined types were placed in this encounter.  No follow-ups on file. Wells Guiles, DO 09/10/2021, 11:07 AM PGY-***, [facility] {    This will disappear when note is signed, click to select method of visit    :1}

## 2021-09-11 ENCOUNTER — Ambulatory Visit: Payer: BC Managed Care – PPO | Admitting: Student

## 2021-10-09 ENCOUNTER — Other Ambulatory Visit: Payer: Self-pay | Admitting: Family Medicine

## 2021-10-29 ENCOUNTER — Telehealth: Payer: Self-pay | Admitting: Pulmonary Disease

## 2021-10-30 ENCOUNTER — Ambulatory Visit: Payer: BC Managed Care – PPO | Admitting: Pulmonary Disease

## 2021-10-30 NOTE — Telephone Encounter (Signed)
Called and spoke with pt letting her know that I was not finding where anyone at our office tried calling her and she verbalized understanding. Nothing further needed.

## 2021-11-17 ENCOUNTER — Ambulatory Visit: Payer: BC Managed Care – PPO | Admitting: Pulmonary Disease

## 2021-11-24 ENCOUNTER — Telehealth: Payer: Self-pay | Admitting: Pulmonary Disease

## 2021-11-24 DIAGNOSIS — G4730 Sleep apnea, unspecified: Secondary | ICD-10-CM

## 2021-11-24 DIAGNOSIS — G4733 Obstructive sleep apnea (adult) (pediatric): Secondary | ICD-10-CM

## 2021-11-24 NOTE — Telephone Encounter (Signed)
Did review the download showing 83% efficiency Average use of 3 hours 49 minutes CPAP at 17  With patient's complaint of intolerance of pressures   DME referral for pressure change  Reduce pressures from 17 down to 14  We will follow-up with a download from the machine  Try to continue to use machine regularly so we can make sure it is still controlling the sleep apnea events optimally

## 2021-11-24 NOTE — Telephone Encounter (Signed)
Called and spoke ot patient and informed her that we were changing setting after Dr Jenetta Downer looked at download. I advised her he was changing her down to 14. Placed order. Nothing further needed

## 2021-11-24 NOTE — Telephone Encounter (Signed)
Called and left voicemail for patient that I was pulling download from her cpap to see if we can make any adjustments.   Sir please advise on download at your desk

## 2021-12-14 ENCOUNTER — Encounter: Payer: Self-pay | Admitting: Pulmonary Disease

## 2021-12-14 ENCOUNTER — Ambulatory Visit (INDEPENDENT_AMBULATORY_CARE_PROVIDER_SITE_OTHER): Payer: BC Managed Care – PPO | Admitting: Pulmonary Disease

## 2021-12-14 VITALS — BP 142/86 | HR 76 | Temp 98.4°F | Ht 68.0 in | Wt 324.8 lb

## 2021-12-14 DIAGNOSIS — G4733 Obstructive sleep apnea (adult) (pediatric): Secondary | ICD-10-CM

## 2021-12-14 NOTE — Progress Notes (Signed)
Subjective:    Patient ID: Regina Burns, female    DOB: 1955/12/03, 66 y.o.   MRN: [id number] Chief complaint: In for follow-up for sleep apnea   HPI Was diagnosed with moderate obstructive sleep apnea Was using CPAP regularly She did get used to using CPAP nightly  Was not having any significant difficulty but stopped using CPAP with recall  She has since gained some weight back some sleepiness during the day  She is now on CPAP of 14 Feels the pressure may be a little bit too much  Usually goes to bed between 9 and 10 PM About 30 minutes to fall asleep 3-4 awakenings Final wake up time about 3 AM  She is a schoolbus driver  She is usually not sleepy during the day  Reformed smoker Able to concentrate well No history of heart disease  Past Medical History:  Diagnosis Date   GERD (gastroesophageal reflux disease) 2005   Hyperlipidemia 2012   Hypertension 2012   Social History   Tobacco Use   Smoking status: Former    Types: Cigarettes    Quit date: 01/18/1998    Years since quitting: 23.9   Smokeless tobacco: Never  Substance Use Topics   Alcohol use: No   Drug use: No    Frequency: 3.0 times per week   Family History  Problem Relation Age of Onset   Alcohol abuse Mother    Alzheimer's disease Mother    Heart disease Mother    Diabetes Mother    Hypertension Mother    Stroke Mother    Heart disease Father    Diabetes Father    Hyperlipidemia Father    Hypertension Father    Alzheimer's disease Maternal Aunt    Hypertension Paternal Aunt    Hypertension Paternal Uncle    Hypertension Maternal Grandmother    Breast cancer Neg Hx    Review of Systems  Constitutional: Negative.   HENT: Negative.    Eyes: Negative.   Respiratory:  Positive for apnea.   Cardiovascular: Negative.   Gastrointestinal: Negative.   Psychiatric/Behavioral:  Positive for sleep disturbance.    Vitals:   12/14/21 0912  BP: (!) 142/86  Pulse: 76  Temp: 98.4  F (36.9 C)  SpO2: 100%      Objective:   Physical Exam Constitutional:      General: She is not in acute distress.    Appearance: She is well-developed. She is not diaphoretic.  HENT:     Head: Normocephalic and atraumatic.     Nose: Nose normal.  Eyes:     General:        Right eye: No discharge.        Left eye: No discharge.     Pupils: Pupils are equal, round, and reactive to light.  Neck:     Trachea: No tracheal deviation.  Cardiovascular:     Rate and Rhythm: Normal rate and regular rhythm.     Heart sounds: No murmur heard. Pulmonary:     Effort: Pulmonary effort is normal. No respiratory distress.     Breath sounds: Normal breath sounds. No stridor. No wheezing, rhonchi or rales.  Musculoskeletal:     Cervical back: No rigidity or tenderness.  Neurological:     Mental Status: She is alert.  Psychiatric:        Mood and Affect: Mood normal.     Sleep study in 2019 with moderate obstructive sleep apnea Compliance download during previous visits did  show adequate treatment with CPAP of 17  On a CPAP of 14, 87% compliance CPAP was decreased to 14 during the last visit with AHI of 2.8      Assessment & Plan:   Moderately severe obstructive sleep apnea with oxygen desaturations -Will continue with CPAP therapy -Will change pressure from 14 down to 12  .  Class III obesity -importance of keeping a weight in check was discussed  .  She has no underlying lung disease   Plan. Change CPAP pressure from 14 down to 12  Encouraged to continue using CPAP nightly  Encouraged to start exercise on a regular basis and try to keep weight in check  Tentative follow-up in about 3 to 4 months  Encouraged to call with any significant concerns

## 2021-12-14 NOTE — Patient Instructions (Signed)
Contact DME company to decrease pressure from 14-12  Keep an eye on your monitoring app to make sure that the number of events is still well-controlled  We still want to keep the AHI below 5  Graded exercises as tolerated  I will see you 6 months from here

## 2021-12-24 ENCOUNTER — Telehealth (INDEPENDENT_AMBULATORY_CARE_PROVIDER_SITE_OTHER): Payer: BC Managed Care – PPO | Admitting: Family Medicine

## 2021-12-24 ENCOUNTER — Telehealth: Payer: Self-pay

## 2021-12-24 DIAGNOSIS — M62830 Muscle spasm of back: Secondary | ICD-10-CM | POA: Diagnosis not present

## 2021-12-24 MED ORDER — BACLOFEN 5 MG PO TABS: 5.0000 mg | ORAL_TABLET | Freq: Three times a day (TID) | ORAL | 0 refills | Status: DC | PRN
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
## 2022-01-04 ENCOUNTER — Other Ambulatory Visit: Payer: Self-pay | Admitting: Student

## 2022-01-08 ENCOUNTER — Other Ambulatory Visit: Payer: Self-pay

## 2022-01-08 MED ORDER — LOVASTATIN 40 MG PO TABS: 40.0000 mg | ORAL_TABLET | Freq: Every day | ORAL | 3 refills | Status: DC

## 2022-04-10 ENCOUNTER — Other Ambulatory Visit: Payer: Self-pay | Admitting: Family Medicine

## 2022-04-10 ENCOUNTER — Other Ambulatory Visit: Payer: Self-pay | Admitting: Student

## 2022-05-16 ENCOUNTER — Other Ambulatory Visit: Payer: Self-pay | Admitting: Family Medicine

## 2022-05-16 ENCOUNTER — Other Ambulatory Visit: Payer: Self-pay | Admitting: Student

## 2022-08-07 ENCOUNTER — Other Ambulatory Visit: Payer: Self-pay | Admitting: Student

## 2022-08-13 ENCOUNTER — Encounter: Payer: Self-pay | Admitting: Student

## 2022-08-13 ENCOUNTER — Ambulatory Visit (INDEPENDENT_AMBULATORY_CARE_PROVIDER_SITE_OTHER): Payer: BC Managed Care – PPO | Admitting: Student

## 2022-08-13 VITALS — BP 136/78 | HR 68 | Ht 68.0 in | Wt 323.2 lb

## 2022-08-13 DIAGNOSIS — E785 Hyperlipidemia, unspecified: Secondary | ICD-10-CM | POA: Insufficient documentation

## 2022-08-13 DIAGNOSIS — E782 Mixed hyperlipidemia: Secondary | ICD-10-CM | POA: Diagnosis not present

## 2022-08-13 DIAGNOSIS — Z131 Encounter for screening for diabetes mellitus: Secondary | ICD-10-CM | POA: Diagnosis not present

## 2022-08-13 DIAGNOSIS — Z1211 Encounter for screening for malignant neoplasm of colon: Secondary | ICD-10-CM

## 2022-08-13 DIAGNOSIS — I10 Essential (primary) hypertension: Secondary | ICD-10-CM

## 2022-08-13 DIAGNOSIS — Z0000 Encounter for general adult medical examination without abnormal findings: Secondary | ICD-10-CM

## 2022-08-13 DIAGNOSIS — R3915 Urgency of urination: Secondary | ICD-10-CM | POA: Diagnosis not present

## 2022-08-13 DIAGNOSIS — R300 Dysuria: Secondary | ICD-10-CM | POA: Insufficient documentation

## 2022-08-13 DIAGNOSIS — Z1231 Encounter for screening mammogram for malignant neoplasm of breast: Secondary | ICD-10-CM

## 2022-08-13 DIAGNOSIS — Z13820 Encounter for screening for osteoporosis: Secondary | ICD-10-CM

## 2022-08-13 DIAGNOSIS — Z780 Asymptomatic menopausal state: Secondary | ICD-10-CM

## 2022-08-13 LAB — POCT GLYCOSYLATED HEMOGLOBIN (HGB A1C): Hemoglobin A1C: 6 % — AB (ref 4.0–5.6)

## 2022-08-13 MED ORDER — ZOSTER VAC RECOMB ADJUVANTED 50 MCG/0.5ML IM SUSR: 0.5000 mL | Freq: Once | INTRAMUSCULAR | 0 refills | Status: AC

## 2022-08-13 MED ORDER — ZOSTER VAC RECOMB ADJUVANTED 50 MCG/0.5ML IM SUSR: 0.5000 mL | Freq: Once | INTRAMUSCULAR | 0 refills | Status: DC

## 2022-08-13 NOTE — Assessment & Plan Note (Signed)
Check UA for UTI.  This may also be related to obesity and history of vaginal deliveries.  Follow-up in 2 weeks to develop entirety of clinical encounter for this assuming there is no UTI.

## 2022-08-13 NOTE — Assessment & Plan Note (Signed)
Annual Examination  See AVS for age appropriate recommendations  PHQ score 4, reviewed and discussed.  BP reviewed and at goal.   Considered the following items based upon USPSTF recommendations: Diabetes screening: ordered Screening for elevated cholesterol: ordered Sexually inactive, no indication for STD screening. Osteoporosis screening considered based upon risk of fracture from [facility] calculator. DEXA ordered.  Reviewed risk factors for latent tuberculosis and not indicated Cervical cancer screening: Screened out Breast cancer screening: discussed potential benefits, risks including overdiagnosis and biopsy, elected proceed with mammogram Colorectal cancer screening: discussed options, elected for fecal immunohistochemical testing Lung cancer screening: not applicable. See documentation below regarding indications/risks/benefits.  Vaccinations: shringrix Rx provided.

## 2022-08-13 NOTE — Assessment & Plan Note (Signed)
BP: 136/78 today. Well controlled. Goal of <140/90. Medication regimen: Carvedilol 25 mg twice daily, chlorthalidone 25 mg daily

## 2022-08-13 NOTE — Patient Instructions (Addendum)
Send will call you to get the bone density scheduled.  I will be in touch with you regarding the labs.  Please follow-up with me in 2 weeks for the urinary concern.  Things to do to Keep yourself Healthy - Exercise at least 30-45 minutes a day, 3-4 days a week. >150 min of moderate intensity per week is advised. - Eat a low-fat diet with lots of fruits and vegetables, up to 7-9 servings per day. - Seatbelts can save your life. Wear them always. - Smoke detectors on every level of your home, check batteries every year. - Eye Doctor - have an eye exam every 1-2 years - Safe sex - if you may be exposed to STDs, use a condom. - Alcohol If you drink, do it moderately, less than 1 drink per day. - Health Care Power of Attorney.  Choose someone to speak for you if you are not able. - Depression is common in our stressful world.If you're feeling down or losing interest in things you normally enjoy, please come in for a visit. - Violence - If anyone is threatening or hurting you, please call immediately.

## 2022-08-13 NOTE — Progress Notes (Signed)
    SUBJECTIVE:   Chief compliant/HPI: annual examination  Regina Burns is a 67 y.o. who presents today for an annual exam.   She also endorses urinary urgency.  She has a history of 2 vaginal deliveries.  She is usually able to hold her urine but recently has needed to use depends.  Denies pain with urination.  Urgency normally bothers her in the morning.  History tabs reviewed and updated.   OBJECTIVE:  BP 136/78   Pulse 68   Ht 5\' 8"  (1.727 m)   Wt (!) 323 lb 3.2 oz (146.6 kg)   SpO2 98%   BMI 49.14 kg/m   General: Well-appearing, NAD CV: RRR, no murmurs auscultated Pulm: CTAB, normal WOB Abdomen: Soft, nontender, normoactive bowel sounds no suprapubic tenderness  ASSESSMENT/PLAN:  Healthcare maintenance Assessment & Plan: Annual Examination  See AVS for age appropriate recommendations  PHQ score 4, reviewed and discussed.  BP reviewed and at goal.   Considered the following items based upon USPSTF recommendations: Diabetes screening: ordered Screening for elevated cholesterol: ordered Sexually inactive, no indication for STD screening. Osteoporosis screening considered based upon risk of fracture from [facility] calculator. DEXA ordered.  Reviewed risk factors for latent tuberculosis and not indicated Cervical cancer screening: Screened out Breast cancer screening: discussed potential benefits, risks including overdiagnosis and biopsy, elected proceed with mammogram Colorectal cancer screening: discussed options, elected for fecal immunohistochemical testing Lung cancer screening: not applicable. See documentation below regarding indications/risks/benefits.  Vaccinations: shringrix Rx provided.   Orders: -     Zoster Vac Recomb Adjuvanted; Inject 0.5 mLs into the muscle once for 1 dose.  Dispense: 0.5 mL; Refill: 0  HYPERTENSION, BENIGN ESSENTIAL Assessment & Plan: BP: 136/78 today. Well controlled. Goal of <140/90. Medication regimen: Carvedilol 25 mg twice  daily, chlorthalidone 25 mg daily  Orders: -     Basic metabolic panel  Mixed hyperlipidemia Assessment & Plan: Remains on lovastatin 40 mg daily, recheck lipid panel today. We have discussed discontinuing her aspirin for primary prevention.  Orders: -     Lipid panel  Urinary urgency Assessment & Plan: Check UA for UTI.  This may also be related to obesity and history of vaginal deliveries.  Follow-up in 2 weeks to develop entirety of clinical encounter for this assuming there is no UTI.  Orders: -     Urinalysis  Encounter for screening mammogram for malignant neoplasm of breast -     Digital Screening Mammogram, Left and Right; Future  Screen for colon cancer -     Fecal occult blood, imunochemical  Screening for diabetes mellitus (DM) -     POCT glycosylated hemoglobin (Hb A1C)  Encounter for osteoporosis screening in asymptomatic postmenopausal patient -     DG Bone Density; Future  Completed information regarding patient's desire to become a foster parent.  Shelby Mattocks, DO [facility]

## 2022-08-13 NOTE — Assessment & Plan Note (Signed)
Remains on lovastatin 40 mg daily, recheck lipid panel today. We have discussed discontinuing her aspirin for primary prevention.

## 2022-08-16 ENCOUNTER — Encounter: Payer: Self-pay | Admitting: Student

## 2022-08-16 DIAGNOSIS — R7303 Prediabetes: Secondary | ICD-10-CM | POA: Insufficient documentation

## 2022-08-31 ENCOUNTER — Ambulatory Visit: Payer: Self-pay | Admitting: Student

## 2022-09-12 ENCOUNTER — Other Ambulatory Visit: Payer: Self-pay | Admitting: Student

## 2022-10-09 ENCOUNTER — Other Ambulatory Visit: Payer: Self-pay | Admitting: Student

## 2022-10-12 NOTE — Telephone Encounter (Signed)
Patient calls nurse line regarding issues with getting prescription.   Called pharmacy. They report that they never received prescription.   Spoke with pharmacist and provided verbal order for chlorthalidone prescription.   Attempted to call patient back to give update, she did not answer. If patient calls back, please let her know that prescription has been called into pharmacy.   Veronda Prude, RN

## 2022-10-23 ENCOUNTER — Other Ambulatory Visit: Payer: Self-pay | Admitting: Student

## 2023-01-21 ENCOUNTER — Other Ambulatory Visit: Payer: Self-pay | Admitting: Student

## 2023-02-14 ENCOUNTER — Other Ambulatory Visit: Payer: Self-pay | Admitting: Student

## 2023-02-14 DIAGNOSIS — Z13820 Encounter for screening for osteoporosis: Secondary | ICD-10-CM

## 2023-02-15 ENCOUNTER — Other Ambulatory Visit: Payer: BC Managed Care – PPO

## 2023-02-15 ENCOUNTER — Ambulatory Visit: Payer: BC Managed Care – PPO

## 2023-03-07 ENCOUNTER — Other Ambulatory Visit: Payer: Self-pay | Admitting: Student

## 2023-03-07 ENCOUNTER — Ambulatory Visit: Payer: BC Managed Care – PPO

## 2023-04-10 ENCOUNTER — Encounter: Payer: Self-pay | Admitting: Pulmonary Disease

## 2023-04-25 ENCOUNTER — Other Ambulatory Visit: Payer: Self-pay | Admitting: Student

## 2023-05-04 ENCOUNTER — Ambulatory Visit: Payer: BC Managed Care – PPO

## 2023-05-06 ENCOUNTER — Ambulatory Visit
Admission: EM | Admit: 2023-05-06 | Discharge: 2023-05-06 | Disposition: A | Discharge status: Home / Self Care | Attending: Physician Assistant | Admitting: Physician Assistant

## 2023-05-06 ENCOUNTER — Other Ambulatory Visit: Payer: Self-pay

## 2023-05-06 DIAGNOSIS — M25512 Pain in left shoulder: Secondary | ICD-10-CM | POA: Diagnosis not present

## 2023-05-06 DIAGNOSIS — S46912A Strain of unspecified muscle, fascia and tendon at shoulder and upper arm level, left arm, initial encounter: Secondary | ICD-10-CM | POA: Diagnosis not present

## 2023-05-06 MED ORDER — BACLOFEN 5 MG PO TABS: 5.0000 mg | ORAL_TABLET | Freq: Three times a day (TID) | ORAL | 0 refills | Status: DC | PRN

## 2023-05-06 MED ORDER — MELOXICAM 7.5 MG PO TABS: 7.5000 mg | ORAL_TABLET | Freq: Every day | ORAL | 0 refills | Status: DC

## 2023-05-06 NOTE — Discharge Instructions (Addendum)
 You were seen today for concerns for left shoulder pain.  At this time I suspect you probably pulled a muscle in your shoulder which is causing your discomfort. This can take some time to recover from and I recommend conservative efforts to help you feel better:  Rest Warm compresses to the area (20 minutes on, minimum of 30 minutes off) You can alternate Tylenol  and Ibuprofen  for pain management but Ibuprofen  is typically preferred to reduce inflammation.  I have sent in a script for Meloxicam  for you to take once per day. DO NOT use other NSAIDs while taking this medication (ibuprofen , motrin , aleve , advil , naproxen , etc)  Please take the meloxicam  with plenty of water and food as it can be a little rough on the stomach. Gentle stretches and exercises that I have included in your paperwork Try to reduce excess strain to the area and rest as much as possible  Wear supportive shoes and, if you must lift anything, use proper lifting techniques that spare your back.   If your symptoms are not improving or seem like they are getting worse over the next 2 to 4 weeks I recommend following up with your primary care provider.  You may need further assessment and a potential referral to physical therapy or orthopedics if your symptoms seem like they are getting worse.

## 2023-05-06 NOTE — ED Provider Notes (Addendum)
 [facility]    CSN: [id number] Arrival date & time: 05/06/23  1732      History   Chief Complaint Chief Complaint  Patient presents with   Shoulder Pain    HPI Regina Burns is a 68 y.o. female.   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  She is right hand dominant   Interventions: She has taken Tylenol , Goodies back and body, BC powder  But reports pain is still present  She denies known injury or trauma to the area She denies numbness, tingling, weakness in the left arm     Past Medical History:  Diagnosis Date   GERD (gastroesophageal reflux disease) 2005   Hyperlipidemia 2012   Hypertension 2012    Patient Active Problem List   Diagnosis Date Noted   Prediabetes 08/16/2022   Hyperlipidemia 08/13/2022   Urinary urgency 08/13/2022   Peripheral neuropathy 04/21/2019   Healthcare maintenance 12/29/2015   Restless leg syndrome 06/08/2014   Sleep apnea 12/31/2009   HYPERTENSION, BENIGN ESSENTIAL 08/09/2006   OBESITY, NOS 03/17/2006    Past Surgical History:  Procedure Laterality Date   APPENDECTOMY  1965   HERNIA REPAIR  1965    OB History     Gravida  4   Para  2   Term      Preterm      AB  2   Living  2      SAB      IAB      Ectopic      Multiple      Live Births               Home Medications    Prior to Admission medications   Medication Sig Start Date End Date Taking? Authorizing Provider  Baclofen  5 MG TABS Take 1 tablet (5 mg total) by mouth 3 (three) times daily as needed. 05/06/23  Yes Gedeon Brandow E, PA-C  meloxicam  (MOBIC ) 7.5 MG tablet Take 1 tablet (7.5 mg total) by mouth daily. 05/06/23  Yes Lydon Vansickle E, PA-C  Baclofen  5 MG TABS TAKE 1  TABLET BY MOUTH THREE TIMES DAILY AS NEEDED 08/12/22   Orlando Pond, DO  carvedilol  (COREG ) 25 MG tablet TAKE 1 TABLET(25 MG) BY MOUTH TWICE DAILY WITH A MEAL 01/24/23   Orlando Pond, DO  chlorthalidone  (HYGROTON ) 25 MG tablet TAKE 1 TABLET BY MOUTH EVERY MORNING 01/21/23   Dahbura, Anton, DO  cycloSPORINE (RESTASIS) 0.05 % ophthalmic emulsion Place 1 drop into both eyes 2 (two) times daily. 10/11/20   [provider]  Ferrous Sulfate (IRON PO) Take 1 tablet by mouth daily.    [provider]  gabapentin  (NEURONTIN ) 300 MG capsule TAKE 1 CAPSULE(300 MG) BY MOUTH AT BEDTIME 03/07/23   Joshua Domino, DO  lovastatin  (MEVACOR ) 40 MG tablet TAKE 1 TABLET(40 MG) BY MOUTH AT BEDTIME 04/25/23   Orlando Pond, DO  Semaglutide -Weight Management 0.25 MG/0.5ML SOAJ Inject 0.25 mg into the skin once a week. Patient not taking: Reported on 12/14/2021 05/21/21   Hope Merle, MD    Family History Family History  Problem Relation Age of Onset   Alcohol abuse Mother  Alzheimer's disease Mother    Heart disease Mother    Diabetes Mother    Hypertension Mother    Stroke Mother    Heart disease Father    Diabetes Father    Hyperlipidemia Father    Hypertension Father    Alzheimer's disease Maternal Aunt    Hypertension Paternal Aunt    Hypertension Paternal Uncle    Hypertension Maternal Grandmother    Breast cancer Neg Hx     Social History Social History   Tobacco Use   Smoking status: Former    Current packs/day: 0.00    Types: Cigarettes    Quit date: 01/18/1998    Years since quitting: 25.3   Smokeless tobacco: Never  Vaping Use   Vaping status: Never Used  Substance Use Topics   Alcohol use: No   Drug use: No    Frequency: 3.0 times per week     Allergies   Ace inhibitors, Lisinopril, Clindamycin /lincomycin, and Contrast media [iodinated contrast media]   Review of Systems Review of Systems  Musculoskeletal:  Positive for myalgias and neck pain.     Physical  Exam Triage Vital Signs ED Triage Vitals  Encounter Vitals Group     BP 05/06/23 1751 (!) 153/77     Systolic BP Percentile --      Diastolic BP Percentile --      Pulse Rate 05/06/23 1751 80     Resp 05/06/23 1751 20     Temp 05/06/23 1751 (!) 97.4 F (36.3 C)     Temp Source 05/06/23 1751 Oral     SpO2 05/06/23 1751 98 %     Weight 05/06/23 1752 (!) 323 lb 3.1 oz (146.6 kg)     Height 05/06/23 1752 5' 8 (1.727 m)     Head Circumference --      Peak Flow --      Pain Score 05/06/23 1751 8     Pain Loc --      Pain Education --      Exclude from Growth Chart --    No data found.  Updated Vital Signs BP (!) 153/77 (BP Location: Right Arm) Comment: pt states she took her BP medication at 1600 today.  Pulse 80   Temp (!) 97.4 F (36.3 C) (Oral)   Resp 20   Ht 5' 8 (1.727 m)   Wt (!) 323 lb 3.1 oz (146.6 kg)   SpO2 98%   BMI 49.14 kg/m   Visual Acuity Right Eye Distance:   Left Eye Distance:   Bilateral Distance:    Right Eye Near:   Left Eye Near:    Bilateral Near:     Physical Exam Vitals reviewed.  Constitutional:      General: She is awake. She is not in acute distress.    Appearance: Normal appearance. She is well-developed and well-groomed. She is not ill-appearing or toxic-appearing.     Comments: Patient is a pleasant 68 year old female who appears stated age and does not appear in acute distress  HENT:     Head: Normocephalic and atraumatic.  Musculoskeletal:     Right shoulder: Normal.     Left shoulder: Tenderness present. No swelling, deformity, effusion or bony tenderness. Normal range of motion. Normal strength. Normal pulse.       Arms:     Cervical back: Normal. Normal range of motion.     Comments: Patient reports that turning her head to the right causes pain in the left shoulder  but not in the neck  Shoulder range of motion findings: Shoulder range of motion is intact and symmetrical with regards to flexion, extension, internal and  external rotation, abduction and adduction. Negative empty can testing bilaterally  Neurological:     Mental Status: She is alert.  Psychiatric:        Attention and Perception: Attention and perception normal.        Mood and Affect: Mood and affect normal.        Speech: Speech normal.        Behavior: Behavior normal. Behavior is cooperative.      UC Treatments / Results  Labs (all labs ordered are listed, but only abnormal results are displayed) Labs Reviewed - No data to display  EKG   Radiology No results found.  Procedures Procedures (including critical care time)  Medications Ordered in UC Medications - No data to display  Initial Impression / Assessment and Plan / UC Course  I have reviewed the triage vital signs and the nursing notes.  Pertinent labs & imaging results that were available during my care of the patient were reviewed by me and considered in my medical decision making (see chart for details).    Most recent eGFR is 72. Patient does not appear to have contraindication to NSAID use   Final Clinical Impressions(s) / UC Diagnoses   Final diagnoses:  Strain of left shoulder, initial encounter  Acute pain of left shoulder   Patient presents today with concerns for left shoulder pain.  She reports pain in the left shoulder blade area as well as tenderness along the superior aspect of the shoulder.  Physical exam does not reveal swelling, bruising or erythema but there is notable spasms and tension in the area as indicated.  Shoulder range of motion is overall intact and symmetrical with the right.  Less concern for rotator cuff injury given intact range of motion and negative empty can testing.  At this time suspect muscle strain of the back and shoulder.  Recommend conservative measures comprised of rest, warm compresses, stretches and massage, pain relievers.  Will send in prescription for meloxicam  7.5 mg p.o. daily.  Reviewed with patient that she  should not take other NSAIDs while taking this and should use Tylenol  as needed for further pain management.  Will also send refill of baclofen .  Reviewed with patient that she should not take this if she needs to remain alert or drive as it is sedating.  Recommend follow-up with PCP if symptoms are not improving or worsening over the next 2 to 4 weeks that she may need referral to physical therapy or orthopedics for ongoing assessment and management.  Follow-up as needed    Discharge Instructions      You were seen today for concerns for left shoulder pain.  At this time I suspect you probably pulled a muscle in your shoulder which is causing your discomfort. This can take some time to recover from and I recommend conservative efforts to help you feel better:  Rest Warm compresses to the area (20 minutes on, minimum of 30 minutes off) You can alternate Tylenol  and Ibuprofen  for pain management but Ibuprofen  is typically preferred to reduce inflammation.  I have sent in a script for Meloxicam  for you to take once per day. DO NOT use other NSAIDs while taking this medication (ibuprofen , motrin , aleve , advil , naproxen , etc)  Please take the meloxicam  with plenty of water and food as it can be a  little rough on the stomach. Gentle stretches and exercises that I have included in your paperwork Try to reduce excess strain to the area and rest as much as possible  Wear supportive shoes and, if you must lift anything, use proper lifting techniques that spare your back.   If your symptoms are not improving or seem like they are getting worse over the next 2 to 4 weeks I recommend following up with your primary care provider.  You may need further assessment and a potential referral to physical therapy or orthopedics if your symptoms seem like they are getting worse.      ED Prescriptions     Medication Sig Dispense Auth. Provider   Baclofen  5 MG TABS Take 1 tablet (5 mg total) by mouth 3 (three)  times daily as needed. 20 tablet Jasmain Ahlberg E, PA-C   meloxicam  (MOBIC ) 7.5 MG tablet Take 1 tablet (7.5 mg total) by mouth daily. 20 tablet Miche Loughridge E, PA-C      PDMP not reviewed this encounter.   Poseidon Pam E, PA-C 05/06/23 1824    Jaimon Bugaj, Rocky BRAVO, PA-C 05/06/23 1826

## 2023-05-06 NOTE — ED Triage Notes (Addendum)
 Pt presents with complaints of left shoulder pain x 3-4 days. Pt currently rates her overall pain an 8/10. OTC Tylenol  & Arthritis Anastacia Powder taken with some relief. Pt denies recent injury to the left shoulder. 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 Describes pain as aching and radiating. Pain increases with movement of neck.

## 2023-05-10 ENCOUNTER — Encounter: Payer: Self-pay | Admitting: Emergency Medicine

## 2023-05-10 ENCOUNTER — Ambulatory Visit
Admission: EM | Admit: 2023-05-10 | Discharge: 2023-05-10 | Disposition: A | Discharge status: Home / Self Care | Attending: Nurse Practitioner | Admitting: Nurse Practitioner

## 2023-05-10 ENCOUNTER — Telehealth: Payer: Self-pay | Admitting: Emergency Medicine

## 2023-05-10 ENCOUNTER — Ambulatory Visit (INDEPENDENT_AMBULATORY_CARE_PROVIDER_SITE_OTHER): Admitting: Radiology

## 2023-05-10 DIAGNOSIS — M25511 Pain in right shoulder: Secondary | ICD-10-CM

## 2023-05-10 MED ORDER — METHYLPREDNISOLONE 4 MG PO TBPK: ORAL_TABLET | ORAL | 0 refills | Status: DC

## 2023-05-10 MED ORDER — DICLOFENAC SODIUM 1 % EX GEL: 4.0000 g | Freq: Four times a day (QID) | CUTANEOUS | 0 refills | Status: DC

## 2023-05-10 NOTE — ED Triage Notes (Addendum)
 Pt c/o left shoulder pain that is worse with neck movement. She was seen on 4/18 at [facility] for same complaint. Was prescribed meloxicam  and baclofen  and advised to used tylenol , warm compresses, and rest. Shoulder pain has not improved.

## 2023-05-10 NOTE — ED Provider Notes (Signed)
 Regina Burns    CSN: [id number] Arrival date & time: 05/10/23  1243      History   Chief Complaint No chief complaint on file.   HPI Regina Burns is a 68 y.o. female.    Regina Burns is a 68 y.o. female who presents with left shoulder pain.  This has been ongoing for the past couple of weeks.  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  The pain is constant and rated moderate in severity at rest.  Turning her head to the right, holding her head up/looking upwards makes the pain worse.  She denies any numbness, tingling, loss of sensation or weakness of the left upper extremity.  There is no neck pain.  Notably, the patient was seen here on 05/06/2023 for the same.  She was diagnosed with an acute muscle strain and prescribed Mobic  and baclofen .  She was also instructed to take Tylenol  as needed with this.  Patient reports compliance with the prescribed medications as well as alternating between ice and heat.  She is on gabapentin  chronically at bedtime for neuropathy in the feet.  She is not diabetic.  Pain is interfering with her ability to work and sleep comfortably.   The following portions of the patient's history were reviewed and updated as appropriate: allergies, current medications, past family history, past medical history, past social history, past surgical history, and problem list.      Past Medical History:  Diagnosis Date   GERD (gastroesophageal reflux disease) 2005   Hyperlipidemia 2012   Hypertension 2012    Patient Active Problem List   Diagnosis Date Noted   Prediabetes 08/16/2022   Hyperlipidemia 08/13/2022   Urinary urgency 08/13/2022   Peripheral neuropathy 04/21/2019   Healthcare maintenance 12/29/2015   Restless leg syndrome 06/08/2014   Sleep apnea  12/31/2009   HYPERTENSION, BENIGN ESSENTIAL 08/09/2006   OBESITY, NOS 03/17/2006    Past Surgical History:  Procedure Laterality Date   APPENDECTOMY  1965   HERNIA REPAIR  1965    OB History     Gravida  4   Para  2   Term      Preterm      AB  2   Living  2      SAB      IAB      Ectopic      Multiple      Live Births               Home Medications    Prior to Admission medications   Medication Sig Start Date End Date Taking? Authorizing Provider  diclofenac  Sodium (VOLTAREN ) 1 % GEL Apply 4 g topically 4 (four) times daily. Apply to affected areas up to four times a day 05/10/23  Yes Charlestine Rookstool, [place], FNP  methylPREDNISolone  (MEDROL  DOSEPAK) 4 MG TBPK tablet Take as directed 05/10/23  Yes Maryruth Sol, FNP  Baclofen  5 MG TABS TAKE 1 TABLET BY MOUTH THREE TIMES DAILY AS NEEDED 08/12/22   Veronia Goon, DO  Baclofen  5 MG TABS Take 1 tablet (5 mg total) by mouth 3 (three) times daily as needed. 05/06/23   Mecum, Erin E, PA-C  carvedilol  (COREG ) 25 MG tablet TAKE 1 TABLET(25 MG) BY MOUTH TWICE DAILY WITH A MEAL 01/24/23  Dahbura, Anton, DO  chlorthalidone  (HYGROTON ) 25 MG tablet TAKE 1 TABLET BY MOUTH EVERY MORNING 01/21/23   Dahbura, Anton, DO  cycloSPORINE (RESTASIS) 0.05 % ophthalmic emulsion Place 1 drop into both eyes 2 (two) times daily. 10/11/20   [provider]  Ferrous Sulfate (IRON PO) Take 1 tablet by mouth daily.    [provider]  gabapentin  (NEURONTIN ) 300 MG capsule TAKE 1 CAPSULE(300 MG) BY MOUTH AT BEDTIME 03/07/23   Glenn Lange, DO  lovastatin  (MEVACOR ) 40 MG tablet TAKE 1 TABLET(40 MG) BY MOUTH AT BEDTIME 04/25/23   Veronia Goon, DO  meloxicam  (MOBIC ) 7.5 MG tablet Take 1 tablet (7.5 mg total) by mouth daily. 05/06/23   Mecum, Erin E, PA-C  Semaglutide -Weight Management 0.25 MG/0.5ML SOAJ Inject 0.25 mg into the skin once a week. Patient not taking: Reported on 12/14/2021 05/21/21   Valli Gaw, MD    Family  History Family History  Problem Relation Age of Onset   Alcohol abuse Mother    Alzheimer's disease Mother    Heart disease Mother    Diabetes Mother    Hypertension Mother    Stroke Mother    Heart disease Father    Diabetes Father    Hyperlipidemia Father    Hypertension Father    Alzheimer's disease Maternal Aunt    Hypertension Paternal Aunt    Hypertension Paternal Uncle    Hypertension Maternal Grandmother    Breast cancer Neg Hx     Social History Social History   Tobacco Use   Smoking status: Former    Current packs/day: 0.00    Types: Cigarettes    Quit date: 01/18/1998    Years since quitting: 25.3   Smokeless tobacco: Never  Vaping Use   Vaping status: Never Used  Substance Use Topics   Alcohol use: No   Drug use: No    Frequency: 3.0 times per week     Allergies   Ace inhibitors, Lisinopril, Clindamycin /lincomycin, and Contrast media [iodinated contrast media]   Review of Systems Review of Systems  Constitutional:  Positive for activity change.  Musculoskeletal:  Positive for arthralgias. Negative for back pain, gait problem, joint swelling, myalgias, neck pain and neck stiffness.  Neurological:  Negative for weakness, numbness and headaches.  All other systems reviewed and are negative.    Physical Exam Triage Vital Signs ED Triage Vitals  Encounter Vitals Group     BP 05/10/23 1302 (!) 152/72     Systolic BP Percentile --      Diastolic BP Percentile --      Pulse Rate 05/10/23 1302 69     Resp 05/10/23 1302 16     Temp 05/10/23 1302 97.7 F (36.5 C)     Temp Source 05/10/23 1302 Oral     SpO2 05/10/23 1302 96 %     Weight --      Height --      Head Circumference --      Peak Flow --      Pain Score 05/10/23 1301 9     Pain Loc --      Pain Education --      Exclude from Growth Chart --    No data found.  Updated Vital Signs BP (!) 152/72 (BP Location: Right Arm)   Pulse 69   Temp 97.7 F (36.5 C) (Oral)   Resp 16   SpO2  96%   Visual Acuity Right Eye Distance:   Left Eye Distance:  Bilateral Distance:    Right Eye Near:   Left Eye Near:    Bilateral Near:     Physical Exam Vitals reviewed.  Constitutional:      General: She is awake. She is not in acute distress.    Appearance: Normal appearance. She is well-developed and well-groomed. She is obese. She is not ill-appearing, toxic-appearing or diaphoretic.  HENT:     Head: Normocephalic.     Mouth/Throat:     Mouth: Mucous membranes are moist.  Eyes:     Conjunctiva/sclera: Conjunctivae normal.  Cardiovascular:     Rate and Rhythm: Normal rate and regular rhythm.     Heart sounds: Normal heart sounds.  Pulmonary:     Effort: Pulmonary effort is normal.     Breath sounds: Normal breath sounds.  Musculoskeletal:        General: Normal range of motion.     Left shoulder: Tenderness present. No swelling, deformity, effusion, bony tenderness or crepitus. Normal range of motion. Normal strength.       Arms:     Cervical back: Full passive range of motion without pain, normal range of motion and neck supple. No rigidity or crepitus. No pain with movement, spinous process tenderness or muscular tenderness. Normal range of motion.     Comments: Tenderness is noted over the left acromioclavicular joint without any bony deformities. No swelling, visible deformity, effusion, or crepitus is appreciated. Full range of motion and preserved strength with shoulder abduction, adduction, internal rotation, and external rotation  Lymphadenopathy:     Cervical: No cervical adenopathy.  Skin:    General: Skin is warm and dry.  Neurological:     General: No focal deficit present.     Mental Status: She is alert and oriented to person, place, and time.     Cranial Nerves: No cranial nerve deficit.     Sensory: Sensation is intact. No sensory deficit.     Motor: Motor function is intact. No weakness.     Coordination: Coordination is intact.  Psychiatric:         Behavior: Behavior is cooperative.      Burns Treatments / Results  Labs (all labs ordered are listed, but only abnormal results are displayed) Labs Reviewed - No data to display  EKG   Radiology No results found.  Procedures Procedures (including critical care time)  Medications Ordered in Burns Medications - No data to display  Initial Impression / Assessment and Plan / Burns Course  I have reviewed the triage vital signs and the nursing notes.  Pertinent labs & imaging results that were available during my care of the patient were reviewed by me and considered in my medical decision making (see chart for details).    68 year old female presenting with ongoing left shoulder pain for the past couple of weeks. She denies any specific injury. She was previously seen on 05/06/2023 and diagnosed with an acute muscle strain, at which time she was prescribed Mobic  and baclofen . The patient reports adherence to the prescribed medications, along with the use of Tylenol  and alternating between ice and heat, but states that the pain persists despite these conservative measures. She is alert, oriented, afebrile, and nontoxic. On physical exam, there is tenderness over the left acromioclavicular joint without deformities, swelling, effusion, or crepitus. The patient has full range of motion, intact strength, and preserved sensation in the affected extremity. No focal neurological deficits were noted. Given that this is her second visit for the same  complaint, imaging of the shoulder was obtained and is currently pending final radiologist interpretation. She was advised to continue taking Mobic , baclofen , and Tylenol  as directed, and counseled to avoid aspirin or over-the-counter NSAIDs while on Mobic . A Medrol  Dosepak and topical diclofenac  were added to her treatment regimen. She was instructed to continue supportive measures with alternating ice and heat and to follow up with orthopedics if her symptoms  do not improve within the next one to two weeks  Today's evaluation has revealed no signs of a dangerous process. Discussed diagnosis with patient and/or guardian. Patient and/or guardian aware of their diagnosis, possible red flag symptoms to watch out for and need for close follow up. Patient and/or guardian understands verbal and written discharge instructions. Patient and/or guardian comfortable with plan and disposition.  Patient and/or guardian has a clear mental status at this time, good insight into illness (after discussion and teaching) and has clear judgment to make decisions regarding their care  Documentation was completed with the aid of voice recognition software. Transcription may contain typographical errors. Final Clinical Impressions(s) / Burns Diagnoses   Final diagnoses:  Acute pain of right shoulder     Discharge Instructions      You were seen today for shoulder pain.   Take all prescribed medications exactly as directed. Do not take any over-the-counter aspirin, Motrin , ibuprofen , or Aleve  while you are taking meloxicam . You may take Tylenol  for additional pain relief--two 500 mg tablets every six hours as needed. Do not exceed 4000 mg of Tylenol  in a 24-hour period. You may continue taking baclofen  up to three times a day for muscle relaxation. Take the steroids as prescribed to help reduce inflammation and pain. You may also apply the prescribed diclofenac  cream to the area up to four times a day.  To help with the discomfort, alternate between using ice and heat on the sore area several times a day. Always place a towel between the ice and your skin--never apply ice directly to the skin. Avoid heavy lifting, pulling, or strenuous use of the affected arm over the next few days to give your shoulder time to heal.   If you are not feeling better within one to two weeks, follow up with orthopedics. You may need advanced imaging such as a CT or MRI to evaluate the structures  of the shoulder in more detail, or you may benefit from physical therapy which orthopedics can farrange this as well.      ED Prescriptions     Medication Sig Dispense Auth. Provider   methylPREDNISolone  (MEDROL  DOSEPAK) 4 MG TBPK tablet Take as directed 21 tablet Maryruth Sol, FNP   diclofenac  Sodium (VOLTAREN ) 1 % GEL Apply 4 g topically 4 (four) times daily. Apply to affected areas up to four times a day 150 g Maryruth Sol, FNP      PDMP not reviewed this encounter.   Maryruth Sol, [place] 05/10/23 1416

## 2023-05-10 NOTE — Discharge Instructions (Addendum)
 You were seen today for shoulder pain.   Take all prescribed medications exactly as directed. Do not take any over-the-counter aspirin, Motrin , ibuprofen , or Aleve  while you are taking meloxicam . You may take Tylenol  for additional pain relief--two 500 mg tablets every six hours as needed. Do not exceed 4000 mg of Tylenol  in a 24-hour period. You may continue taking baclofen  up to three times a day for muscle relaxation. Take the steroids as prescribed to help reduce inflammation and pain. You may also apply the prescribed diclofenac  cream to the area up to four times a day.  To help with the discomfort, alternate between using ice and heat on the sore area several times a day. Always place a towel between the ice and your skin--never apply ice directly to the skin. Avoid heavy lifting, pulling, or strenuous use of the affected arm over the next few days to give your shoulder time to heal.   If you are not feeling better within one to two weeks, follow up with orthopedics. You may need advanced imaging such as a CT or MRI to evaluate the structures of the shoulder in more detail, or you may benefit from physical therapy which orthopedics can farrange this as well.

## 2023-05-17 ENCOUNTER — Ambulatory Visit: Admitting: Student

## 2023-05-17 VITALS — BP 138/83 | HR 73 | Wt 337.8 lb

## 2023-05-17 DIAGNOSIS — M19012 Primary osteoarthritis, left shoulder: Secondary | ICD-10-CM | POA: Diagnosis not present

## 2023-05-17 NOTE — Progress Notes (Signed)
  SUBJECTIVE:   CHIEF COMPLAINT / HPI:   Shoulder pain: Presented with left shoulder pain on 4/18 and 4/22 to urgent care.  She works as a Midwife.  She was at first diagnosed with acute muscle strain and prescribed Mobic  and baclofen .  On 4/22, she was prescribed Medrol  Dosepak and topical Voltaren .  X-ray of left shoulder shows degenerative changes and osteophyte formation along AC joint. She does not note any improvement after finishing those medications. Currently taking tylenol  650mg  every 8 hours. Not taking Meloxicam . She is using the voltaren  gel.   PERTINENT  PMH / PSH: HTN, HLD, prediabetes, restless leg syndrome  OBJECTIVE:  BP 138/83   Pulse 73   Wt (!) 337 lb 12.8 oz (153.2 kg)   SpO2 99%   BMI 51.36 kg/m  General: Well-appearing, NAD Shoulder, left: TTP noted at the left trapezius. No evidence of bony deformity, asymmetry, or muscle atrophy; No tenderness over long head of biceps (bicipital groove). Full active and passive range of motion (180 flex Regina Burns /150Abd /90ER /70IR) although pain with internal rotation and abduction, Thumb to T12 without significant tenderness. Strength 5/5 throughout.     ASSESSMENT/PLAN:   Assessment & Plan Primary osteoarthritis of left shoulder Discussed conservative management Tylenol  1000 mg every 6 hours as needed, ibuprofen  600 mg every 8 hours as needed, I Profen no longer than 10 days.  Referral to PT.  Injection may be beneficial although given her anatomy, I would send her to sports medicine for this.  Ultimately in the future she may need shoulder replacement. Return if symptoms worsen or fail to improve. Regina Goon, DO 05/17/2023, 11:56 AM PGY-3, [facility]

## 2023-05-17 NOTE — Patient Instructions (Addendum)
 It was great to see you today! Thank you for choosing [facility] for your primary care.  Today we addressed: You have osteoarthritis of your left shoulder.  Lets try physical therapy.  Should you feel that you do need further invention with a shoulder injection, I may refer you to physical therapy for this.  In terms of medications, you can take Tylenol  1000 mg every 6 hours as needed and ibuprofen  600 mg every 8 hours as needed.  I would not take the ibuprofen  for longer than 10 days.  If you haven't already, sign up for My Chart to have easy access to your labs results, and communication with your primary care physician.  Return if symptoms worsen or fail to improve. Please arrive 15 minutes before your appointment to ensure smooth check in process.  We appreciate your efforts in making this happen.  Thank you for allowing me to participate in your care, Veronia Goon, DO 05/17/2023, 11:25 AM PGY-3, [facility]

## 2023-05-22 ENCOUNTER — Encounter: Payer: Self-pay | Admitting: Student

## 2023-05-30 NOTE — Therapy (Addendum)
 OUTPATIENT PHYSICAL THERAPY  EVALUATION AND DISCHARGE  PHYSICAL THERAPY DISCHARGE SUMMARY  Visits from Start of Care: 1  Current functional level related to goals / functional outcomes: Unknown   Remaining deficits: Unknown   Education / Equipment: HEP and education provided on eval day   Patient agrees to discharge. Patient goals were not met. Patient is being discharged due to not returning since the last visit. Pt was scheduled and then canceled 3 visits and no-showed to 2.     Patient Name: Regina Burns MRN: [id number] DOB:10-Jun-1955, 68 y.o., female Today's Date: 05/31/2023  END OF SESSION:  PT End of Session - 05/31/23 1240     Visit Number 1    Number of Visits 20    Date for PT Re-Evaluation 08/09/23    Authorization Type [employer] State $52 copay    Progress Note Due on Visit 10    PT Start Time 1243    PT Stop Time 1319    PT Time Calculation (min) 36 min    Activity Tolerance Patient tolerated treatment well    Behavior During Therapy WFL for tasks assessed/performed             Past Medical History:  Diagnosis Date   GERD (gastroesophageal reflux disease) 2005   Hyperlipidemia 2012   Hypertension 2012   Past Surgical History:  Procedure Laterality Date   APPENDECTOMY  1965   HERNIA REPAIR  1965   Patient Active Problem List   Diagnosis Date Noted   Prediabetes 08/16/2022   Hyperlipidemia 08/13/2022   Urinary urgency 08/13/2022   Peripheral neuropathy 04/21/2019   Healthcare maintenance 12/29/2015   Restless leg syndrome 06/08/2014   Sleep apnea 12/31/2009   HYPERTENSION, BENIGN ESSENTIAL 08/09/2006   OBESITY, NOS 03/17/2006    PCP: Orlando Pond DO  REFERRING PROVIDER: Anders Otto DASEN, MD  REFERRING DIAG: [phone] (ICD-10-CM) - Primary osteoarthritis of left shoulder  THERAPY DIAG:  Chronic left shoulder pain  Cervicalgia  Radiculopathy, cervical region  Muscle weakness (generalized)  Abnormal posture  Rationale  for Evaluation and Treatment: Rehabilitation  ONSET DATE: Acute on Chronic.  Worsening April 2025.   SUBJECTIVE:                                                                                                                                                                                      SUBJECTIVE STATEMENT: Pt indicated complaints of Lt shoulder pain, Lt neck pain and some numbness/tingling into Lt hand.  Pt indicated difficulty sleeping due to symptoms.  Pt indicated acute on chronic pain symptoms with insidious worsening.  Pt indicated no pain medicine seems to help symptoms at this  time and no help with topical cream.   PERTINENT HISTORY: GERD, Hyperlipidemia, HTN  PAIN:  NPRS scale: current 7/10, at worst 10/10, at best 4/10 Pain location: Lt shoulder, Lt neck, Lt hand  Pain description: sharp, ache, tingling in hand Aggravating factors: lifting, pulling, pushing, reaching overhead.  Relieving factors: propping arm in recliner.  PRECAUTIONS: None  WEIGHT BEARING RESTRICTIONS: No  FALLS:  Has patient fallen in last 6 months? 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   PLOF: Independent, Rt hand dominant  PATIENT GOALS: Reduce pain,   OBJECTIVE:   DIAGNOSTIC FINDINGS:  Xrays 05/10/2023 IMPRESSION: Degenerative changes. Moderate of the AC joint and mild of the glenohumeral joint.  PATIENT SURVEYS:  Patient-Specific Activity Scoring Scheme  0 represents "unable to perform." 10 represents "able to perform at prior level. 0 1 2 3 4 5 6 7 8 9  10 (Date and Score)   Activity Eval  05/31/2023    1. Driving bus  4    2. Push/pull wheelchair  4    3. Bending neck 4   4. Lifting students 4   5. Lift door movement on bus 4   Score 4    Total score = sum of the activity scores/number of activities Minimum detectable change (90%CI) for average score = 2  points Minimum detectable change (90%CI) for single activity score = 3 points  COGNITION: 05/31/2023 Overall cognitive status: WFL     SENSATION: 05/31/2023 Not tested  POSTURE: 05/31/2023 Rounded shoulders, mild forward head posture.   CERVICAL ROM:   05/31/2023: Cervical range of motion impacted Lt arm symptoms as noted below.    Active ROM AROM (deg) eval  Flexion 55 c neck/shoulder pain with no tingling noted  Extension 55 c neck/shoulder pain with tingling increase  Right lateral flexion   Left lateral flexion   Right rotation 75 c no pain change  Left rotation 75 c no pain change   (Blank rows = not tested)   UPPER EXTREMITY ROM:   ROM Right Eval 05/31/2023 Left Eval 05/31/2023  Shoulder flexion [facility] WFL s symptoms at end range  Shoulder extension    Shoulder abduction    Shoulder adduction    Shoulder internal rotation    Shoulder external rotation    Elbow flexion    Elbow extension    Wrist flexion    Wrist extension    Wrist ulnar deviation    Wrist radial deviation    Wrist pronation    Wrist supination    (Blank rows = not tested)  UPPER EXTREMITY MMT:  MMT Right Eval 05/31/2023 Left Eval 05/31/2023  Shoulder flexion 5/5 4+/5  Shoulder extension    Shoulder abduction 5/5 3+/5 c pain  Shoulder adduction    Shoulder internal rotation 5/5 5/5  Shoulder external rotation 5/5 4+/5  Middle trapezius    Lower trapezius    Elbow flexion 5/5 5/5  Elbow extension 5/5 5/5  Wrist flexion    Wrist extension    Wrist ulnar deviation    Wrist radial deviation    Wrist pronation    Wrist supination    Grip strength (lbs)    (Blank rows = not tested)  SPECIAL TESTS: 05/31/2023 (+) Spurlings compression bilateral for Lt arm symptoms.  (+) Reduction with cervical distraction testing.  (+) painful arc Lt shoulder in elevation.  (-) Drop arm testing Lt.   JOINT MOBILITY TESTING:  05/31/2023 No specific testing today  PALPATION:   05/31/2023 Tenderness with trigger points Lt upper trap, Lt infraspinatus with localized symptoms noted.                                                                                                                                                                                                   TODAY'S TREATMENT:                                                                                                       DATE: 05/31/2023 Therex:    HEP instruction/performance c cues for techniques, handout provided.  Trial set performed of each for comprehension and symptom assessment.  See below for exercise list  Manual Supine cervical distraction intermittent for radicular symptom relief.  Positive results.    PATIENT EDUCATION: 05/31/2023 Education details: HEP, POC Person educated: Patient Education method: Programmer, multimedia, Demonstration, Verbal cues, and Handouts Education comprehension: verbalized understanding, returned demonstration, and verbal cues required  HOME EXERCISE PROGRAM: Access Code: [email or web address] URL: [email or web address] Date: 05/31/2023 Prepared by: Ozell Silvan  Exercises - Seated Scapular Retraction  - 3-5 x daily - 7 x weekly - 1 sets - 5-10 reps - 3-5 hold - Cervical Retraction at Wall  - 2 x daily - 7 x weekly - 1 sets - 10 reps - 5 hold - Seated Upper Trapezius Stretch (Mirrored)  - 2 x daily - 7 x weekly - 1 sets - 5 reps - 15 hold  ASSESSMENT:  CLINICAL IMPRESSION: Patient is a 67 y.o. who comes to clinic with complaints of cervical, Lt shoulder pain with Lt radicular symptoms noted with mobility, strength and movement coordination deficits that impair their ability to perform usual daily and recreational functional activities without increase difficulty/symptoms at this time.  Presentation today seemed to implicate cervical radicular involvement as well as Lt shoulder complaints   Patient to benefit from skilled PT services to address impairments  and limitations to improve to previous level of function without restriction secondary to condition.   OBJECTIVE IMPAIRMENTS: decreased activity tolerance, decreased coordination, decreased endurance, decreased mobility, decreased ROM, decreased strength, increased fascial restrictions, impaired perceived functional ability, increased  muscle spasms, impaired flexibility, impaired UE functional use, improper body mechanics, postural dysfunction, and pain.   ACTIVITY LIMITATIONS: carrying, lifting, bending, reach over head, and caring for others  PARTICIPATION LIMITATIONS: meal prep, cleaning, laundry, interpersonal relationship, driving, shopping, community activity, and occupation  PERSONAL FACTORS: Time since onset of injury/illness/exacerbation and GERD, Hyperlipidemia, HTN are also affecting patient's functional outcome.   REHAB POTENTIAL: Good  CLINICAL DECISION MAKING: Evolving/moderate complexity  EVALUATION COMPLEXITY: Moderate   GOALS: Goals reviewed with patient? Yes  SHORT TERM GOALS: (target date for Short term goals are 3 weeks 06/21/2023)  1.Patient will demonstrate independent use of home exercise program to maintain progress from in clinic treatments. Goal status: New  LONG TERM GOALS: (target dates for all long term goals are 10 weeks  08/09/2023 )   1. Patient will demonstrate/report pain at worst less than or equal to 2/10 to facilitate minimal limitation in daily activity secondary to pain symptoms. Goal status: New   2. Patient will demonstrate independent use of home exercise program to facilitate ability to maintain/progress functional gains from skilled physical therapy services. Goal status: New   3. Patient will demonstrate Patient specific functional scale avg > or = 8/10 to indicate reduced disability due to condition.  Goal status: New   4.  Patient will demonstrate Lt UE MMT 5/5 throughout to facilitate lifting, reaching, carrying at [facility] in daily  activity.   Goal status: New   5.  Patient will demonstrate cervical AROM WFL s symptoms to facilitate usual driving and work activity at [employer].    Goal status: New   6.  Patient will demonstrate ability to sleep s disruption due to pain.  Goal status: New    PLAN:  PT FREQUENCY: 1-2x/week  PT DURATION: 10 weeks  PLANNED INTERVENTIONS: Can include 02853- PT Re-evaluation, 97110-Therapeutic exercises, 97530- Therapeutic activity, 97112- Neuromuscular re-education, 97535- Self Care, 97140- Manual therapy, [phone]- Gait training, [phone]- Orthotic Fit/training, [phone]- Canalith repositioning, [id number]- Aquatic Therapy, [phone]- Electrical stimulation (unattended), [phone]- Electrical stimulation (manual), [id number] Physical performance testing, 97016- Vasopneumatic device, [id number]- Ultrasound, [id number]- Traction (mechanical), [id number]- Ionotophoresis 4mg /ml Dexamethasone, Patient/Family education, Balance training, Stair training, Taping, Dry Needling, Joint mobilization, Joint manipulation, Spinal manipulation, Spinal mobilization, Scar mobilization, Vestibular training, Visual/preceptual remediation/compensation, DME instructions, Cryotherapy, and Moist heat.  All performed as medically necessary.  All included unless contraindicated  PLAN FOR NEXT SESSION: Review HEP knowledge/results.  Cervical distraction/traction for Lt radicular symptoms.  Early postural activation/strengthening as tolerated.    Ozell Silvan, PT, DPT, OCS, ATC 05/31/23  1:26 PM

## 2023-05-31 ENCOUNTER — Encounter: Payer: Self-pay | Admitting: Rehabilitative and Restorative Service Providers"

## 2023-05-31 ENCOUNTER — Ambulatory Visit (INDEPENDENT_AMBULATORY_CARE_PROVIDER_SITE_OTHER): Admitting: Rehabilitative and Restorative Service Providers"

## 2023-05-31 DIAGNOSIS — M25512 Pain in left shoulder: Secondary | ICD-10-CM | POA: Diagnosis not present

## 2023-05-31 DIAGNOSIS — M6281 Muscle weakness (generalized): Secondary | ICD-10-CM

## 2023-05-31 DIAGNOSIS — M5412 Radiculopathy, cervical region: Secondary | ICD-10-CM

## 2023-05-31 DIAGNOSIS — R293 Abnormal posture: Secondary | ICD-10-CM

## 2023-05-31 DIAGNOSIS — M542 Cervicalgia: Secondary | ICD-10-CM | POA: Diagnosis not present

## 2023-05-31 DIAGNOSIS — G8929 Other chronic pain: Secondary | ICD-10-CM

## 2023-06-02 ENCOUNTER — Encounter: Admitting: Physical Therapy

## 2023-06-18 ENCOUNTER — Other Ambulatory Visit: Payer: Self-pay | Admitting: Physician Assistant

## 2023-06-20 NOTE — Telephone Encounter (Signed)
 [facility] patient- provider no longer at practice Requested Prescriptions  Pending Prescriptions Disp Refills   meloxicam  (MOBIC ) 7.5 MG tablet [Pharmacy Med Name: MELOXICAM  7.5MG  TABLETS] 20 tablet 0    Sig: TAKE 1 TABLET(7.5 MG) BY MOUTH DAILY     Analgesics:  COX2 Inhibitors Failed - 06/20/2023  3:27 PM      Failed - Manual Review: Labs are only required if the patient has taken medication for more than 8 weeks.      Failed - HGB in normal range and within 360 days    Hemoglobin  Date Value Ref Range Status  10/24/2020 12.8 12.0 - 15.0 g/dL Final  91/47/8295 62.1 11.1 - 15.9 g/dL Final         Failed - HCT in normal range and within 360 days    HCT  Date Value Ref Range Status  10/24/2020 38.5 36.0 - 46.0 % Final   Hematocrit  Date Value Ref Range Status  04/18/2019 40.3 34.0 - 46.6 % Final         Failed - AST in normal range and within 360 days    AST  Date Value Ref Range Status  01/05/2021 20 0 - 40 IU/L Final         Failed - ALT in normal range and within 360 days    ALT  Date Value Ref Range Status  01/05/2021 26 0 - 32 IU/L Final         Failed - Valid encounter within last 12 months    Recent Outpatient Visits   None            Passed - Cr in normal range and within 360 days    Creat  Date Value Ref Range Status  03/18/2016 1.00 (H) 0.50 - 0.99 mg/dL Final    Comment:      For patients > or = 68 years of age: The upper reference limit for Creatinine is approximately 13% higher for people identified as African-American.      Creatinine, Ser  Date Value Ref Range Status  08/13/2022 0.88 0.57 - 1.00 mg/dL Final         Passed - eGFR is 30 or above and within 360 days    GFR, Est African American  Date Value Ref Range Status  03/18/2016 71 >=60 mL/min Final   GFR calc Af Amer  Date Value Ref Range Status  04/18/2019 68 >59 mL/min/1.73 Final   GFR, Est Non African American  Date Value Ref Range Status  03/18/2016 61 >=60 mL/min Final   GFR,  Estimated  Date Value Ref Range Status  10/24/2020 >60 >60 mL/min Final    Comment:    (NOTE) Calculated using the CKD-EPI Creatinine Equation (2021)    eGFR  Date Value Ref Range Status  08/13/2022 72 >59 mL/min/1.73 Final         Passed - Patient is not pregnant

## 2023-06-24 ENCOUNTER — Encounter: Admitting: Rehabilitative and Restorative Service Providers"

## 2023-06-28 ENCOUNTER — Encounter: Payer: Self-pay | Admitting: *Deleted

## 2023-07-01 ENCOUNTER — Encounter: Admitting: Physical Therapy

## 2023-07-08 ENCOUNTER — Encounter: Admitting: Physical Therapy

## 2023-07-08 NOTE — Therapy (Deleted)
 OUTPATIENT PHYSICAL THERAPY  EVALUATION   Patient Name: Regina Burns MRN: [id number] DOB:May 29, 1955, 68 y.o., female Today's Date: 07/08/2023  END OF SESSION:    Past Medical History:  Diagnosis Date   GERD (gastroesophageal reflux disease) 2005   Hyperlipidemia 2012   Hypertension 2012   Past Surgical History:  Procedure Laterality Date   APPENDECTOMY  1965   HERNIA REPAIR  1965   Patient Active Problem List   Diagnosis Date Noted   Prediabetes 08/16/2022   Hyperlipidemia 08/13/2022   Urinary urgency 08/13/2022   Peripheral neuropathy 04/21/2019   Healthcare maintenance 12/29/2015   Restless leg syndrome 06/08/2014   Sleep apnea 12/31/2009   HYPERTENSION, BENIGN ESSENTIAL 08/09/2006   OBESITY, NOS 03/17/2006    PCP: Veronia Goon DO  REFERRING PROVIDER: Arn Lane, MD  REFERRING DIAG: [phone] (ICD-10-CM) - Primary osteoarthritis of left shoulder  THERAPY DIAG:  No diagnosis found.  Rationale for Evaluation and Treatment: Rehabilitation  ONSET DATE: Acute on Chronic.  Worsening April 2025.   SUBJECTIVE:                                                                                                                                                                                      SUBJECTIVE STATEMENT: *** Pt indicated complaints of Lt shoulder pain, Lt neck pain and some numbness/tingling into Lt hand.  Pt indicated difficulty sleeping due to symptoms.  Pt indicated acute on chronic pain symptoms with insidious worsening.  Pt indicated no pain medicine seems to help symptoms at this time and no help with topical cream.   PERTINENT HISTORY: GERD, Hyperlipidemia, HTN  PAIN:  NPRS scale: current 7/10, at worst 10/10, at best 4/10 Pain location: Lt shoulder, Lt neck, Lt hand  Pain description: sharp, ache, tingling in hand Aggravating factors: lifting, pulling, pushing, reaching overhead.  Relieving factors: propping arm in  recliner.  PRECAUTIONS: None  WEIGHT BEARING RESTRICTIONS: No  FALLS:  Has patient fallen in last 6 months? 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   PLOF: Independent, Rt hand dominant  PATIENT GOALS: Reduce pain,   OBJECTIVE:   DIAGNOSTIC FINDINGS:  Xrays 05/10/2023 IMPRESSION: Degenerative changes. Moderate of the AC joint and mild of the glenohumeral joint.  PATIENT SURVEYS:  Patient-Specific Activity Scoring Scheme  0 represents "unable to perform." 10 represents "able to perform at prior level. 0 1 2 3 4 5 6 7 8 9  10 (Date and Score)   Activity Eval  05/31/2023    1. Driving bus  4    2. Push/pull wheelchair  4  3. Bending neck 4   4. Lifting students 4   5. Lift door movement on bus 4   Score 4    Total score = sum of the activity scores/number of activities Minimum detectable change (90%CI) for average score = 2 points Minimum detectable change (90%CI) for single activity score = 3 points  COGNITION: 05/31/2023 Overall cognitive status: WFL     SENSATION: 05/31/2023 Not tested  POSTURE: 05/31/2023 Rounded shoulders, mild forward head posture.   CERVICAL ROM:   05/31/2023: Cervical range of motion impacted Lt arm symptoms as noted below.    Active ROM AROM (deg) eval  Flexion 55 c neck/shoulder pain with no tingling noted  Extension 55 c neck/shoulder pain with tingling increase  Right lateral flexion   Left lateral flexion   Right rotation 75 c no pain change  Left rotation 75 c no pain change   (Blank rows = not tested)   UPPER EXTREMITY ROM:   ROM Right Eval 05/31/2023 Left Eval 05/31/2023  Shoulder flexion [facility] WFL s symptoms at end range  Shoulder extension    Shoulder abduction    Shoulder adduction    Shoulder internal rotation    Shoulder external rotation    Elbow flexion    Elbow extension    Wrist flexion     Wrist extension    Wrist ulnar deviation    Wrist radial deviation    Wrist pronation    Wrist supination    (Blank rows = not tested)  UPPER EXTREMITY MMT:  MMT Right Eval 05/31/2023 Left Eval 05/31/2023  Shoulder flexion 5/5 4+/5  Shoulder extension    Shoulder abduction 5/5 3+/5 c pain  Shoulder adduction    Shoulder internal rotation 5/5 5/5  Shoulder external rotation 5/5 4+/5  Middle trapezius    Lower trapezius    Elbow flexion 5/5 5/5  Elbow extension 5/5 5/5  Wrist flexion    Wrist extension    Wrist ulnar deviation    Wrist radial deviation    Wrist pronation    Wrist supination    Grip strength (lbs)    (Blank rows = not tested)  SPECIAL TESTS: 05/31/2023 (+) Spurlings compression bilateral for Lt arm symptoms.  (+) Reduction with cervical distraction testing.  (+) painful arc Lt shoulder in elevation.  (-) Drop arm testing Lt.   JOINT MOBILITY TESTING:  05/31/2023 No specific testing today  PALPATION:  05/31/2023 Tenderness with trigger points Lt upper trap, Lt infraspinatus with localized symptoms noted.  TODAY'S TREATMENT:                                                                                                       DATE: 07/08/2023 Therex: ***  TODAY'S TREATMENT:                                                                                                       DATE: 05/31/2023 Therex:    HEP instruction/performance c cues for techniques, handout provided.  Trial set performed of each for comprehension and symptom assessment.  See below for exercise list  Manual Supine cervical distraction intermittent for radicular symptom relief.  Positive results.    PATIENT EDUCATION: 05/31/2023 Education details: HEP, POC Person educated: Patient Education  method: Programmer, multimedia, Demonstration, Verbal cues, and Handouts Education comprehension: verbalized understanding, returned demonstration, and verbal cues required  HOME EXERCISE PROGRAM: Access Code: [email or web address] URL: [email or web address] Date: 05/31/2023 Prepared by: Bonna Bustard  Exercises - Seated Scapular Retraction  - 3-5 x daily - 7 x weekly - 1 sets - 5-10 reps - 3-5 hold - Cervical Retraction at Wall  - 2 x daily - 7 x weekly - 1 sets - 10 reps - 5 hold - Seated Upper Trapezius Stretch (Mirrored)  - 2 x daily - 7 x weekly - 1 sets - 5 reps - 15 hold  ASSESSMENT:  CLINICAL IMPRESSION: ***  From eval: Patient is a 68 y.o. who comes to clinic with complaints of cervical, Lt shoulder pain with Lt radicular symptoms noted with mobility, strength and movement coordination deficits that impair their ability to perform usual daily and recreational functional activities without increase difficulty/symptoms at this time.  Presentation today seemed to implicate cervical radicular involvement as well as Lt shoulder complaints   Patient to benefit from skilled PT services to address impairments and limitations to improve to previous level of function without restriction secondary to condition.   OBJECTIVE IMPAIRMENTS: decreased activity tolerance, decreased coordination, decreased endurance, decreased mobility, decreased ROM, decreased strength, increased fascial restrictions, impaired perceived functional ability, increased muscle spasms, impaired flexibility, impaired UE functional use, improper body mechanics, postural dysfunction, and pain.    GOALS: Goals reviewed with patient? Yes  SHORT TERM GOALS: (target date for Short term goals are 3 weeks 06/21/2023)  1.Patient will demonstrate independent use of home exercise program to maintain progress from in clinic treatments. Goal status: New  LONG TERM GOALS: (target dates for all long term goals are 10 weeks  08/09/2023 )    1. Patient will demonstrate/report pain at worst less than or equal to 2/10 to facilitate minimal limitation in daily activity secondary to pain symptoms. Goal status:  New   2. Patient will demonstrate independent use of home exercise program to facilitate ability to maintain/progress functional gains from skilled physical therapy services. Goal status: New   3. Patient will demonstrate Patient specific functional scale avg > or = 8/10 to indicate reduced disability due to condition.  Goal status: New   4.  Patient will demonstrate Lt UE MMT 5/5 throughout to facilitate lifting, reaching, carrying at [facility] in daily activity.   Goal status: New   5.  Patient will demonstrate cervical AROM WFL s symptoms to facilitate usual driving and work activity at [employer].    Goal status: New   6.  Patient will demonstrate ability to sleep s disruption due to pain.  Goal status: New    PLAN:  PT FREQUENCY: 1-2x/week  PT DURATION: 10 weeks  PLANNED INTERVENTIONS: Can include 16109- PT Re-evaluation, 97110-Therapeutic exercises, 97530- Therapeutic activity, [id number]- Neuromuscular re-education, 97535- Self Care, 97140- Manual therapy, [phone]- Gait training, [phone]- Orthotic Fit/training, [id number]- Canalith repositioning, [id number]- Aquatic Therapy, [phone]- Electrical stimulation (unattended), [phone]- Electrical stimulation (manual), [id number] Physical performance testing, 97016- Vasopneumatic device, [id number]- Ultrasound, [id number]- Traction (mechanical), [id number]- Ionotophoresis 4mg /ml Dexamethasone, Patient/Family education, Balance training, Stair training, Taping, Dry Needling, Joint mobilization, Joint manipulation, Spinal manipulation, Spinal mobilization, Scar mobilization, Vestibular training, Visual/preceptual remediation/compensation, DME instructions, Cryotherapy, and Moist heat.  All performed as medically necessary.  All included unless contraindicated  PLAN FOR NEXT SESSION: Review HEP knowledge/results.  Cervical  distraction/traction for Lt radicular symptoms.  Early postural activation/strengthening as tolerated.    Makynleigh Breslin April Ma L Tenleigh Byer, PT, DPT 07/08/23  7:53 AM

## 2023-07-15 ENCOUNTER — Encounter: Admitting: Physical Therapy

## 2023-07-15 NOTE — Therapy (Deleted)
 OUTPATIENT PHYSICAL THERAPY TREATMENT   Patient Name: Regina Burns MRN: [id number] DOB:March 04, 1955, 68 y.o., female Today's Date: 07/15/2023  END OF SESSION:    Past Medical History:  Diagnosis Date   GERD (gastroesophageal reflux disease) 2005   Hyperlipidemia 2012   Hypertension 2012   Past Surgical History:  Procedure Laterality Date   APPENDECTOMY  1965   HERNIA REPAIR  1965   Patient Active Problem List   Diagnosis Date Noted   Prediabetes 08/16/2022   Hyperlipidemia 08/13/2022   Urinary urgency 08/13/2022   Peripheral neuropathy 04/21/2019   Healthcare maintenance 12/29/2015   Restless leg syndrome 06/08/2014   Sleep apnea 12/31/2009   HYPERTENSION, BENIGN ESSENTIAL 08/09/2006   OBESITY, NOS 03/17/2006    PCP: Orlando Pond DO  REFERRING PROVIDER: Anders Otto DASEN, MD  REFERRING DIAG: [phone] (ICD-10-CM) - Primary osteoarthritis of left shoulder  THERAPY DIAG:  No diagnosis found.  Rationale for Evaluation and Treatment: Rehabilitation  ONSET DATE: Acute on Chronic.  Worsening Regina 2025.   SUBJECTIVE:                                                                                                                                                                                      SUBJECTIVE STATEMENT: *** Pt indicated complaints of Lt shoulder pain, Lt neck pain and some numbness/tingling into Lt hand.  Pt indicated difficulty sleeping due to symptoms.  Pt indicated acute on chronic pain symptoms with insidious worsening.  Pt indicated no pain medicine seems to help symptoms at this time and no help with topical cream.   PERTINENT HISTORY: GERD, Hyperlipidemia, HTN  PAIN:  NPRS scale: current 7/10, at worst 10/10, at best 4/10 Pain location: Lt shoulder, Lt neck, Lt hand  Pain description: sharp, ache, tingling in hand Aggravating factors: lifting, pulling, pushing, reaching overhead.  Relieving factors: propping arm in  recliner.  PRECAUTIONS: None  WEIGHT BEARING RESTRICTIONS: No  FALLS:  Has patient fallen in last 6 months? 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   PLOF: Independent, Rt hand dominant  PATIENT GOALS: Reduce pain,   OBJECTIVE:   DIAGNOSTIC FINDINGS:  Xrays 05/10/2023 IMPRESSION: Degenerative changes. Moderate of the AC joint and mild of the glenohumeral joint.  PATIENT SURVEYS:  Patient-Specific Activity Scoring Scheme  0 represents "unable to perform." 10 represents "able to perform at prior level. 0 1 2 3 4 5 6 7 8 9  10 (Date and Score)   Activity Eval  05/31/2023    1. Driving bus  4    2. Push/pull wheelchair  4  3. Bending neck 4   4. Lifting students 4   5. Lift door movement on bus 4   Score 4    Total score = sum of the activity scores/number of activities Minimum detectable change (90%CI) for average score = 2 points Minimum detectable change (90%CI) for single activity score = 3 points  COGNITION: 05/31/2023 Overall cognitive status: WFL     SENSATION: 05/31/2023 Not tested  POSTURE: 05/31/2023 Rounded shoulders, mild forward head posture.   CERVICAL ROM:   05/31/2023: Cervical range of motion impacted Lt arm symptoms as noted below.    Active ROM AROM (deg) eval  Flexion 55 c neck/shoulder pain with no tingling noted  Extension 55 c neck/shoulder pain with tingling increase  Right lateral flexion   Left lateral flexion   Right rotation 75 c no pain change  Left rotation 75 c no pain change   (Blank rows = not tested)   UPPER EXTREMITY ROM:   ROM Right Eval 05/31/2023 Left Eval 05/31/2023  Shoulder flexion [facility] WFL s symptoms at end range  Shoulder extension    Shoulder abduction    Shoulder adduction    Shoulder internal rotation    Shoulder external rotation    Elbow flexion    Elbow extension    Wrist flexion     Wrist extension    Wrist ulnar deviation    Wrist radial deviation    Wrist pronation    Wrist supination    (Blank rows = not tested)  UPPER EXTREMITY MMT:  MMT Right Eval 05/31/2023 Left Eval 05/31/2023  Shoulder flexion 5/5 4+/5  Shoulder extension    Shoulder abduction 5/5 3+/5 c pain  Shoulder adduction    Shoulder internal rotation 5/5 5/5  Shoulder external rotation 5/5 4+/5  Middle trapezius    Lower trapezius    Elbow flexion 5/5 5/5  Elbow extension 5/5 5/5  Wrist flexion    Wrist extension    Wrist ulnar deviation    Wrist radial deviation    Wrist pronation    Wrist supination    Grip strength (lbs)    (Blank rows = not tested)  SPECIAL TESTS: 05/31/2023 (+) Spurlings compression bilateral for Lt arm symptoms.  (+) Reduction with cervical distraction testing.  (+) painful arc Lt shoulder in elevation.  (-) Drop arm testing Lt.   JOINT MOBILITY TESTING:  05/31/2023 No specific testing today  PALPATION:  05/31/2023 Tenderness with trigger points Lt upper trap, Lt infraspinatus with localized symptoms noted.  TODAY'S TREATMENT:                                                                                                       DATE: 07/08/2023 Therex: ***  TODAY'S TREATMENT:                                                                                                       DATE: 05/31/2023 Therex:    HEP instruction/performance c cues for techniques, handout provided.  Trial set performed of each for comprehension and symptom assessment.  See below for exercise list  Manual Supine cervical distraction intermittent for radicular symptom relief.  Positive results.    PATIENT EDUCATION: 05/31/2023 Education details: HEP, POC Person educated: Patient Education  method: Programmer, multimedia, Demonstration, Verbal cues, and Handouts Education comprehension: verbalized understanding, returned demonstration, and verbal cues required  HOME EXERCISE PROGRAM: Access Code: [email or web address] URL: [email or web address] Date: 05/31/2023 Prepared by: Ozell Silvan  Exercises - Seated Scapular Retraction  - 3-5 x daily - 7 x weekly - 1 sets - 5-10 reps - 3-5 hold - Cervical Retraction at Wall  - 2 x daily - 7 x weekly - 1 sets - 10 reps - 5 hold - Seated Upper Trapezius Stretch (Mirrored)  - 2 x daily - 7 x weekly - 1 sets - 5 reps - 15 hold  ASSESSMENT:  CLINICAL IMPRESSION: ***  From eval: Patient is a 68 y.o. who comes to clinic with complaints of cervical, Lt shoulder pain with Lt radicular symptoms noted with mobility, strength and movement coordination deficits that impair their ability to perform usual daily and recreational functional activities without increase difficulty/symptoms at this time.  Presentation today seemed to implicate cervical radicular involvement as well as Lt shoulder complaints   Patient to benefit from skilled PT services to address impairments and limitations to improve to previous level of function without restriction secondary to condition.   OBJECTIVE IMPAIRMENTS: decreased activity tolerance, decreased coordination, decreased endurance, decreased mobility, decreased ROM, decreased strength, increased fascial restrictions, impaired perceived functional ability, increased muscle spasms, impaired flexibility, impaired UE functional use, improper body mechanics, postural dysfunction, and pain.    GOALS: Goals reviewed with patient? Yes  SHORT TERM GOALS: (target date for Short term goals are 3 weeks 06/21/2023)  1.Patient will demonstrate independent use of home exercise program to maintain progress from in clinic treatments. Goal status: New  LONG TERM GOALS: (target dates for all long term goals are 10 weeks  08/09/2023 )    1. Patient will demonstrate/report pain at worst less than or equal to 2/10 to facilitate minimal limitation in daily activity secondary to pain symptoms. Goal status:  New   2. Patient will demonstrate independent use of home exercise program to facilitate ability to maintain/progress functional gains from skilled physical therapy services. Goal status: New   3. Patient will demonstrate Patient specific functional scale avg > or = 8/10 to indicate reduced disability due to condition.  Goal status: New   4.  Patient will demonstrate Lt UE MMT 5/5 throughout to facilitate lifting, reaching, carrying at [facility] in daily activity.   Goal status: New   5.  Patient will demonstrate cervical AROM WFL s symptoms to facilitate usual driving and work activity at [employer].    Goal status: New   6.  Patient will demonstrate ability to sleep s disruption due to pain.  Goal status: New    PLAN:  PT FREQUENCY: 1-2x/week  PT DURATION: 10 weeks  PLANNED INTERVENTIONS: Can include 02853- PT Re-evaluation, 97110-Therapeutic exercises, 97530- Therapeutic activity, 97112- Neuromuscular re-education, 97535- Self Care, 97140- Manual therapy, [phone]- Gait training, [phone]- Orthotic Fit/training, [phone]- Canalith repositioning, [id number]- Aquatic Therapy, [phone]- Electrical stimulation (unattended), [phone]- Electrical stimulation (manual), [id number] Physical performance testing, 97016- Vasopneumatic device, [id number]- Ultrasound, [id number]- Traction (mechanical), [id number]- Ionotophoresis 4mg /ml Dexamethasone, Patient/Family education, Balance training, Stair training, Taping, Dry Needling, Joint mobilization, Joint manipulation, Spinal manipulation, Spinal mobilization, Scar mobilization, Vestibular training, Visual/preceptual remediation/compensation, DME instructions, Cryotherapy, and Moist heat.  All performed as medically necessary.  All included unless contraindicated  PLAN FOR NEXT SESSION: Review HEP knowledge/results.  Cervical  distraction/traction for Lt radicular symptoms.  Early postural activation/strengthening as tolerated.    Regina Burns Regina Burns, PT, DPT 07/15/23  7:56 AM

## 2023-07-28 ENCOUNTER — Other Ambulatory Visit: Payer: Self-pay

## 2023-07-28 MED ORDER — LOVASTATIN 40 MG PO TABS: 40.0000 mg | ORAL_TABLET | Freq: Every day | ORAL | 1 refills | Status: DC

## 2023-08-04 ENCOUNTER — Telehealth: Payer: Self-pay

## 2023-08-04 MED ORDER — LOVASTATIN 40 MG PO TABS: 40.0000 mg | ORAL_TABLET | Freq: Every day | ORAL | 1 refills | Status: DC

## 2023-08-04 NOTE — Telephone Encounter (Signed)
 Patient calls nurse line in regards to Lovastatin .   She reports her insurance is now requiring her to use [facility] vs [facility].   I have cancelled the prescription at [facility] and have resent to [facility].   I have updated her pharmacy as well in epic.

## 2023-08-08 ENCOUNTER — Ambulatory Visit

## 2023-08-12 ENCOUNTER — Ambulatory Visit

## 2023-09-01 ENCOUNTER — Ambulatory Visit (INDEPENDENT_AMBULATORY_CARE_PROVIDER_SITE_OTHER): Payer: Self-pay

## 2023-09-01 VITALS — BP 140/74 | HR 73 | Wt 351.0 lb

## 2023-09-01 DIAGNOSIS — R7309 Other abnormal glucose: Secondary | ICD-10-CM

## 2023-09-01 DIAGNOSIS — M170 Bilateral primary osteoarthritis of knee: Secondary | ICD-10-CM

## 2023-09-01 DIAGNOSIS — E119 Type 2 diabetes mellitus without complications: Secondary | ICD-10-CM

## 2023-09-01 DIAGNOSIS — I10 Essential (primary) hypertension: Secondary | ICD-10-CM

## 2023-09-01 DIAGNOSIS — Z1231 Encounter for screening mammogram for malignant neoplasm of breast: Secondary | ICD-10-CM

## 2023-09-01 DIAGNOSIS — Z0000 Encounter for general adult medical examination without abnormal findings: Secondary | ICD-10-CM

## 2023-09-01 DIAGNOSIS — N3946 Mixed incontinence: Secondary | ICD-10-CM

## 2023-09-01 DIAGNOSIS — E782 Mixed hyperlipidemia: Secondary | ICD-10-CM

## 2023-09-01 LAB — POCT GLYCOSYLATED HEMOGLOBIN (HGB A1C): Hemoglobin A1C: 6.7 % — AB (ref 4.0–5.6)

## 2023-09-01 MED ORDER — SEMAGLUTIDE(0.25 OR 0.5MG/DOS) 2 MG/1.5ML ~~LOC~~ SOPN: 0.2500 mg | PEN_INJECTOR | SUBCUTANEOUS | 3 refills | Status: DC

## 2023-09-01 MED ORDER — MELOXICAM 15 MG PO TABS: 15.0000 mg | ORAL_TABLET | Freq: Every day | ORAL | 0 refills | Status: DC

## 2023-09-01 NOTE — Progress Notes (Signed)
    SUBJECTIVE:   Chief compliant/HPI: weight, knee pain, urinary incontinence  Regina Burns is a 68 y.o. who presents today for multiple concerns.  Weight loss   Prior weight loss with exercise and diet. Now exercise inhibited by knee pain She is very upset by her weight.  Knee Pain Pain inhibits her ability to walk. Primarily she has start up pain and pain with prolonged weight bearing and ambulation No mechanical symptoms Tylenol  unhelpful. She would like to walk, but pain prevents regular exercise.  Meloxicam  previously helpful. No gi upset.  BP BP usually 130-140/70. Taking carvedilol  and chlorthalidone  as prescribed  Urinary incontinence Wears diapers for incontinence.  Urinary incontinence when she stands and walks Unable to get to the toilet in the morning before urinating if she hasn't peed over night  Tobacco use: quit October 28, 1998  PMH HTN Obesity HLD Prediabetes  OBJECTIVE:   BP (!) 156/74   Pulse 73   Wt (!) 351 lb (159.2 kg)   SpO2 98%   BMI 53.37 kg/m    General: Well appearing female CV: RRR PULM: CTAB Abd: obese, non tender Knee: bilateral full extension, flexion limited to 120 due to body habitus, no joint line tenderness, no effusion  ASSESSMENT/PLAN:   Assessment & Plan Diabetes mellitus without complication (HCC) - A1C 6.7 - starting Ozempic  as patient also has comorbid obesity and sleep apnea  Primary osteoarthritis of both knees - Meloxicam  15 mg daily #14 - Continue Tylenol  1000 mg daily for pain relief during exercise - Consider Xray and corticosteroid injections in the future if pain does not respond to NSAID and weight loss Morbid obesity (HCC) - Prior weight loss with exercise and diet. Now exercise inhibited by knee pain - Encouraged diet and exercise. Pain control for knee pain provided.  Mixed stress and urge urinary incontinence - NIH bladder diary provided - follow up in four weeks  Mixed  hyperlipidemia - lovastatin  40 mg daily  - lipid panel today Essential hypertension, benign - BP sounds controlled at home; 156/74>140/70 in office - Patient will do daily BP log and bring that back at her next appointment - Patient will bring BP cuff to next appointment to make sure it is accurate - Carvedilol  25 mg BID, Chlorthalidone  25 mg daily - BMP today Encounter for screening mammogram for malignant neoplasm of breast Breast cancer screening: discussed potential benefits, risks including overdiagnosis and biopsy, elected proceed with mammogram Routine adult health maintenance Sexually inactive, no indication for STD screening. Osteoporosis screening considered based upon risk of fracture from [facility] calculator. DEXA scheduled for 09/27/23 Patient declined CRC screening after counseling Patient does not meet criteria for lung cancer screening: age 68-80, 20 pack-year history of smoking and current smoker or quit within past 15 years.  Interested in flu vaccine at next visit if available  Follow up in 4 weeks to evaluate BP log/BP cuff calibration, evaluate and treat urinary symptoms, follow up on ozempic .    Regina Burns Alena Morrison, MD [facility]

## 2023-09-01 NOTE — Assessment & Plan Note (Signed)
-   BP sounds controlled at home; 156/74>140/70 in office - Patient will do daily BP log and bring that back at her next appointment - Patient will bring BP cuff to next appointment to make sure it is accurate - Carvedilol  25 mg BID, Chlorthalidone  25 mg daily - BMP today

## 2023-09-01 NOTE — Patient Instructions (Addendum)
 It was wonderful to see you today.  Please bring ALL of your medications with you to every visit.   Today we talked about:  For weight loss, start Ozempic . Use meloxicam  once a day if you need it for your knee pain so you can exercise to lose weight. Focus on a healthy, nutritional diet.  For your blood pressure, keep your medications the same. Check your Blood pressure daily, write it down, and bring this log back to me at your next visit. Bring your blood pressure cuff with you to the next visit.  For your urinary symptoms, fill out the bladder diary for three days.  Thank you for choosing [facility].   Please call [phone] with any questions about today's appointment.  Please arrive at least 15 minutes prior to your scheduled appointments.   If you had blood work today, I will send you a MyChart message or a letter if results are normal. Otherwise, I will give you a call.   If you had a referral placed, they will call you to set up an appointment. Please give us  a call if you don't hear back in the next 2 weeks.   If you need additional refills before your next appointment, please call your pharmacy first.   Ashonte Angelucci Alena Morrison, MD  [facility]

## 2023-09-01 NOTE — Assessment & Plan Note (Signed)
-   lovastatin  40 mg daily  - lipid panel today

## 2023-09-02 ENCOUNTER — Ambulatory Visit: Payer: Self-pay

## 2023-09-02 DIAGNOSIS — E782 Mixed hyperlipidemia: Secondary | ICD-10-CM

## 2023-09-02 LAB — BASIC METABOLIC PANEL WITH GFR
BUN/Creatinine Ratio: 15 (ref 12–28)
BUN: 14 mg/dL (ref 8–27)
CO2: 26 mmol/L (ref 20–29)
Calcium: 9.8 mg/dL (ref 8.7–10.3)
Chloride: 100 mmol/L (ref 96–106)
Creatinine, Ser: 0.96 mg/dL (ref 0.57–1.00)
Glucose: 86 mg/dL (ref 70–99)
Potassium: 4.1 mmol/L (ref 3.5–5.2)
Sodium: 140 mmol/L (ref 134–144)
eGFR: 64 mL/min/1.73 (ref >59)

## 2023-09-02 LAB — LIPID PANEL
Chol/HDL Ratio: 3.5 ratio (ref 0.0–4.4)
Cholesterol, Total: 215 mg/dL — ABNORMAL HIGH (ref 100–199)
HDL: 61 mg/dL (ref >39)
LDL Chol Calc (NIH): 129 mg/dL — ABNORMAL HIGH (ref 0–99)
Triglycerides: 141 mg/dL (ref 0–149)
VLDL Cholesterol Cal: 25 mg/dL (ref 5–40)

## 2023-09-02 MED ORDER — ATORVASTATIN CALCIUM 40 MG PO TABS: 40.0000 mg | ORAL_TABLET | Freq: Every day | ORAL | 3 refills | Status: DC

## 2023-09-02 NOTE — Progress Notes (Signed)
 I conferred with Dr. Koval, pharmacy, and changed her lovastatin  40 mg to high-intensity statin, Atorvastatin  40 mg, given her new diagnosis of DM and elevated LDL/cholesterol compared to last year. I spoke with Ms. Israel-McPhee and she is aware of the change. We also continued our discussion of dietary modifications for her DM and HLD. She is scheduled to follow up with me in 4 weeks, and we will recheck a lipid panel at that time.   Elio Art, MD

## 2023-09-08 ENCOUNTER — Telehealth: Payer: Self-pay

## 2023-09-08 NOTE — Telephone Encounter (Signed)
 Patient calls nurse line in regards to Ozempic .   She reports she was newly diagnosed with Diabetes and prescribed this medication.   She reports her insurance is requiring a prior authorization.   Advised will forward to pharmacy team for assistance.

## 2023-09-09 ENCOUNTER — Other Ambulatory Visit (HOSPITAL_COMMUNITY): Payer: Self-pay

## 2023-09-12 NOTE — Telephone Encounter (Signed)
 Called patient.   She reports she has [employer] and Medicare.   She reports she was working up until recently, however is not anymore and lost previous coverage.   She reports she will take a picture of the front and back of her new card and send via mychart.

## 2023-09-22 ENCOUNTER — Other Ambulatory Visit: Payer: Self-pay

## 2023-09-23 MED ORDER — GABAPENTIN 300 MG PO CAPS: 300.0000 mg | ORAL_CAPSULE | Freq: Every day | ORAL | 1 refills | Status: DC

## 2023-09-27 ENCOUNTER — Other Ambulatory Visit (HOSPITAL_BASED_OUTPATIENT_CLINIC_OR_DEPARTMENT_OTHER): Payer: Self-pay

## 2023-09-29 ENCOUNTER — Other Ambulatory Visit (HOSPITAL_COMMUNITY): Payer: Self-pay

## 2023-09-29 ENCOUNTER — Ambulatory Visit: Payer: Self-pay

## 2023-09-29 NOTE — Telephone Encounter (Signed)
 Pharmacy Patient Advocate Encounter  Received notification from [facility] [employer]VANT[employer]/RX ADVANCE that Prior Authorization for OZEMPIC  0.25/0.5MG  has been APPROVED from 09/29/23 to 09/28/24   PA #/Case ID/Reference #: [id number]

## 2023-09-29 NOTE — Telephone Encounter (Signed)
 Prior authorization submitted for OZEMPIC  0.25/0.5MG  to [facility] ADVANTAGE/RX ADVANCE via Latent.   Key: [id number]

## 2023-09-29 NOTE — Telephone Encounter (Signed)
 Patient returns call to nurse line regarding PA on Ozempic .   She has uploaded her insurance card and this is now on file.   Will forward to [place] for further assistance.   Chiquita JAYSON English, RN

## 2023-09-30 ENCOUNTER — Other Ambulatory Visit: Payer: Self-pay

## 2023-09-30 NOTE — Telephone Encounter (Signed)
 Called and LVM on pharmacy voicemail advising that Ozempic  was approved.   Called patient and provided with update.   Chiquita JAYSON English, RN

## 2023-10-27 DIAGNOSIS — H04123 Dry eye syndrome of bilateral lacrimal glands: Secondary | ICD-10-CM | POA: Diagnosis not present

## 2023-10-27 DIAGNOSIS — H25813 Combined forms of age-related cataract, bilateral: Secondary | ICD-10-CM | POA: Diagnosis not present

## 2023-10-27 DIAGNOSIS — E113293 Type 2 diabetes mellitus with mild nonproliferative diabetic retinopathy without macular edema, bilateral: Secondary | ICD-10-CM | POA: Diagnosis not present

## 2023-10-27 DIAGNOSIS — H50111 Monocular exotropia, right eye: Secondary | ICD-10-CM | POA: Diagnosis not present

## 2023-10-31 ENCOUNTER — Other Ambulatory Visit: Payer: Self-pay

## 2023-10-31 MED ORDER — CARVEDILOL 25 MG PO TABS: 25.0000 mg | ORAL_TABLET | Freq: Two times a day (BID) | ORAL | 3 refills | Status: AC

## 2023-10-31 MED ORDER — CHLORTHALIDONE 25 MG PO TABS: 25.0000 mg | ORAL_TABLET | Freq: Every morning | ORAL | 1 refills | Status: AC

## 2023-11-24 DIAGNOSIS — H50111 Monocular exotropia, right eye: Secondary | ICD-10-CM | POA: Diagnosis not present

## 2023-11-24 DIAGNOSIS — H10023 Other mucopurulent conjunctivitis, bilateral: Secondary | ICD-10-CM | POA: Diagnosis not present

## 2023-11-24 DIAGNOSIS — H04123 Dry eye syndrome of bilateral lacrimal glands: Secondary | ICD-10-CM | POA: Diagnosis not present

## 2023-11-24 DIAGNOSIS — H25813 Combined forms of age-related cataract, bilateral: Secondary | ICD-10-CM | POA: Diagnosis not present

## 2023-11-28 ENCOUNTER — Ambulatory Visit: Admitting: Radiology

## 2023-11-28 ENCOUNTER — Ambulatory Visit
Admission: EM | Admit: 2023-11-28 | Discharge: 2023-11-28 | Disposition: A | Discharge status: Home / Self Care | Attending: Emergency Medicine | Admitting: Emergency Medicine

## 2023-11-28 ENCOUNTER — Other Ambulatory Visit: Payer: Self-pay

## 2023-11-28 VITALS — BP 144/80 | HR 83 | Temp 98.6°F | Resp 20 | Ht 68.0 in | Wt 340.0 lb

## 2023-11-28 DIAGNOSIS — R059 Cough, unspecified: Secondary | ICD-10-CM | POA: Diagnosis not present

## 2023-11-28 DIAGNOSIS — J929 Pleural plaque without asbestos: Secondary | ICD-10-CM | POA: Diagnosis not present

## 2023-11-28 DIAGNOSIS — J019 Acute sinusitis, unspecified: Secondary | ICD-10-CM | POA: Diagnosis not present

## 2023-11-28 DIAGNOSIS — R051 Acute cough: Secondary | ICD-10-CM

## 2023-11-28 DIAGNOSIS — J40 Bronchitis, not specified as acute or chronic: Secondary | ICD-10-CM | POA: Diagnosis not present

## 2023-11-28 DIAGNOSIS — B9689 Other specified bacterial agents as the cause of diseases classified elsewhere: Secondary | ICD-10-CM

## 2023-11-28 MED ORDER — ALBUTEROL SULFATE (2.5 MG/3ML) 0.083% IN NEBU
2.5000 mg | INHALATION_SOLUTION | Freq: Once | RESPIRATORY_TRACT | Status: AC
Start: 2023-11-28 — End: 2023-11-28
  Administered 2023-11-28: 2.5 mg via RESPIRATORY_TRACT

## 2023-11-28 MED ORDER — AMOXICILLIN-POT CLAVULANATE 875-125 MG PO TABS: 1.0000 | ORAL_TABLET | Freq: Two times a day (BID) | ORAL | 0 refills | Status: AC

## 2023-11-28 MED ORDER — PROMETHAZINE-DM 6.25-15 MG/5ML PO SYRP: 5.0000 mL | ORAL_SOLUTION | Freq: Every evening | ORAL | 0 refills | Status: AC | PRN

## 2023-11-28 MED ORDER — ALBUTEROL SULFATE HFA 108 (90 BASE) MCG/ACT IN AERS: 2.0000 | INHALATION_SPRAY | Freq: Four times a day (QID) | RESPIRATORY_TRACT | 1 refills | Status: AC | PRN

## 2023-11-28 NOTE — Discharge Instructions (Addendum)
 Xray does not show any pneumonia.  However with your duration of symptoms I would like to start you on an antibiotic. Please take the Augmentin twice daily for 7 days. Take with food to avoid upset stomach. This will treat sinus infection as well.    The promethazine DM cough syrup can be used once before bed. Be cautious as this might make you drowsy.   Albuterol inhaler 2-3 times daily for several days.  Please go to the emergency department if symptoms worsen.

## 2023-11-28 NOTE — ED Provider Notes (Signed)
 [facility]    CSN: [id number] Arrival date & time: 11/28/23  1618      History   Chief Complaint Chief Complaint  Patient presents with   Cough   Shortness of Breath    HPI Regina Burns is a 68 y.o. female.  Presents with 8 day history of cough and nasal congestion. Congestion has minimally improved over the week but persisting. Cough productive.  During cough attacks she feels wheezing and shortness of breath. Does not have these symptoms at rest.  No fevers known   Using OTC decongestant and cold meds, tylenol    No known sick contacts. Denies recent travel.   Remote smoking history   New DM dx. A1c 2 months ago was 6.7  Past Medical History:  Diagnosis Date   GERD (gastroesophageal reflux disease) 2005   Hyperlipidemia 2012   Hypertension 2012    Patient Active Problem List   Diagnosis Date Noted   Prediabetes 08/16/2022   Hyperlipidemia 08/13/2022   Urinary urgency 08/13/2022   Peripheral neuropathy 04/21/2019   Healthcare maintenance 12/29/2015   Restless leg syndrome 06/08/2014   Sleep apnea 12/31/2009   HYPERTENSION, BENIGN ESSENTIAL 08/09/2006   OBESITY, NOS 03/17/2006    Past Surgical History:  Procedure Laterality Date   APPENDECTOMY  1965   HERNIA REPAIR  1965    OB History     Gravida  4   Para  2   Term      Preterm      AB  2   Living  2      SAB      IAB      Ectopic      Multiple      Live Births               Home Medications    Prior to Admission medications   Medication Sig Start Date End Date Taking? Authorizing Provider  albuterol (VENTOLIN HFA) 108 (90 Base) MCG/ACT inhaler Inhale 2 puffs into the lungs every 6 (six) hours as needed for wheezing or shortness of breath. 11/28/23  Yes Juell Radney, PA-C  amoxicillin-clavulanate (AUGMENTIN) 875-125 MG tablet Take 1 tablet by mouth 2 (two) times daily for 7 days. 11/28/23 12/05/23 Yes Adalina Dopson, Asberry, PA-C   promethazine-dextromethorphan (PROMETHAZINE-DM) 6.25-15 MG/5ML syrup Take 5 mLs by mouth at bedtime as needed for cough. 11/28/23  Yes Sahasra Belue, Asberry, PA-C  atorvastatin  (LIPITOR) 40 MG tablet Take 1 tablet (40 mg total) by mouth daily. 09/02/23   Alena Morrison, Reagan, MD  carvedilol  (COREG ) 25 MG tablet Take 1 tablet (25 mg total) by mouth 2 (two) times daily with a meal. TAKE 1 TABLET(25 MG) BY MOUTH TWICE DAILY WITH A MEAL 10/31/23   Alena Morrison, Reagan, MD  chlorthalidone  (HYGROTON ) 25 MG tablet Take 1 tablet (25 mg total) by mouth every morning. 10/31/23   Alena Morrison, Reagan, MD  cycloSPORINE (RESTASIS) 0.05 % ophthalmic emulsion Place 1 drop into both eyes 2 (two) times daily. 10/11/20   [provider]  Ferrous Sulfate (IRON PO) Take 1 tablet by mouth daily. Patient not taking: Reported on 09/01/2023    [provider]  gabapentin  (NEURONTIN ) 300 MG capsule Take 1 capsule (300 mg total) by mouth at bedtime. 09/23/23   Alena Morrison, Reagan, MD  meloxicam  (MOBIC ) 15 MG tablet Take 1 tablet (15 mg total) by mouth daily. 09/01/23   Alena Morrison, Reagan, MD  Semaglutide ,0.25 or 0.5MG /DOS, 2 MG/1.5ML SOPN Inject 0.25 mg into  the skin once a week. 0.25 mg once weekly for 4 weeks then increase to 0.5 mg weekly for at least 4 weeks,max 1 mg 09/01/23   Alena Morrison, Reagan, MD    Family History Family History  Problem Relation Age of Onset   Alcohol abuse Mother    Alzheimer's disease Mother    Heart disease Mother    Diabetes Mother    Hypertension Mother    Stroke Mother    Heart disease Father    Diabetes Father    Hyperlipidemia Father    Hypertension Father    Alzheimer's disease Maternal Aunt    Hypertension Paternal Aunt    Hypertension Paternal Uncle    Hypertension Maternal Grandmother    Breast cancer Neg Hx     Social History Social History   Tobacco Use   Smoking status: Former    Current packs/day: 0.00    Types:  Cigarettes    Quit date: 01/18/1998    Years since quitting: 25.8    Passive exposure: Past   Smokeless tobacco: Never  Vaping Use   Vaping status: Never Used  Substance Use Topics   Alcohol use: No   Drug use: No    Frequency: 3.0 times per week     Allergies   Ace inhibitors, Lisinopril, Clindamycin /lincomycin, and Contrast media [iodinated contrast media]   Review of Systems Review of Systems As per HPI  Physical Exam Triage Vital Signs ED Triage Vitals [11/28/23 1632]  Encounter Vitals Group     BP (!) 144/80     Girls Systolic BP Percentile      Girls Diastolic BP Percentile      Boys Systolic BP Percentile      Boys Diastolic BP Percentile      Pulse Rate 83     Resp 20     Temp 98.6 F (37 C)     Temp Source Oral     SpO2 96 %     Weight (!) 340 lb (154.2 kg)     Height 5' 8 (1.727 m)     Head Circumference      Peak Flow      Pain Score      Pain Loc      Pain Education      Exclude from Growth Chart    No data found.  Updated Vital Signs BP (!) 144/80 (BP Location: Right Arm)   Pulse 83   Temp 98.6 F (37 C) (Oral)   Resp 20   Ht 5' 8 (1.727 m)   Wt (!) 340 lb (154.2 kg)   SpO2 96% Comment: Check with portable pulse ox not the dinamap in the room  BMI 51.70 kg/m    Physical Exam Vitals and nursing note reviewed.  Constitutional:      General: She is not in acute distress.    Appearance: Normal appearance. She is not ill-appearing or diaphoretic.  HENT:     Right Ear: Tympanic membrane and ear canal normal.     Left Ear: Tympanic membrane and ear canal normal.     Nose: Congestion present.     Mouth/Throat:     Mouth: Mucous membranes are moist.     Pharynx: Oropharynx is clear.  Eyes:     Conjunctiva/sclera: Conjunctivae normal.  Cardiovascular:     Rate and Rhythm: Normal rate and regular rhythm.     Heart sounds: Normal heart sounds.  Pulmonary:     Effort: Pulmonary effort is normal.  No respiratory distress.     Breath  sounds: Rales present.     Comments: Rales in lower lobes and left upper lobe. Faint expiratory wheeze upper lobes. Wet sounding cough in clinic. Speaks in full sentences.  Abdominal:     Palpations: Abdomen is soft.  Musculoskeletal:     Cervical back: Normal range of motion. No rigidity or tenderness.  Lymphadenopathy:     Cervical: No cervical adenopathy.  Skin:    General: Skin is warm and dry.  Neurological:     Mental Status: She is alert and oriented to person, place, and time.     Sensory: No sensory deficit.     Motor: No weakness.     Coordination: Coordination normal.     UC Treatments / Results  Labs (all labs ordered are listed, but only abnormal results are displayed) Labs Reviewed - No data to display  EKG  Radiology DG Chest 2 View Result Date: 11/28/2023 EXAM: 2 VIEW(S) XRAY OF THE CHEST 11/28/2023 05:10:07 PM COMPARISON: 10 / 7 / 22. CLINICAL HISTORY: cough and shob, crackles, 8 days FINDINGS: LUNGS AND PLEURA: Perihilar and lower lung bronchial wall thickening. No focal pulmonary opacity. No pulmonary edema. No pleural effusion. No pneumothorax. HEART AND MEDIASTINUM: No acute abnormality of the cardiac and mediastinal silhouettes. BONES AND SOFT TISSUES: Thoracic degenerative changes. IMPRESSION: 1. Bronchitis / reactive airways. Electronically signed by: Norman Gatlin MD 11/28/2023 05:31 PM EST RP Workstation: [id number]    Procedures Procedures   Medications Ordered in UC Medications  albuterol (PROVENTIL) (2.5 MG/3ML) 0.083% nebulizer solution 2.5 mg (2.5 mg Nebulization Given 11/28/23 1719)    Initial Impression / Assessment and Plan / UC Course  I have reviewed the triage vital signs and the nursing notes.  Pertinent labs & imaging results that were available during my care of the patient were reviewed by me and considered in my medical decision making (see chart for details).  Afebrile, overall well appearing. Sating 96% on room air Some  crackles and faint expiratory wheezing on exam Albuterol nebulizer given with much improvement in symptoms.  Chest xray with bronchial wall thickening. Images independently reviewed by me, agree with radiology interpretation. However with 8 day history of symptoms and findings on lung exam, cover with augmentin BID x 7 days. Promethazine DM only at night. Albuterol inhaler TID. Defer steroids given new DM diagnosis. Strict return and ED precautions. Patient is agreeable to plan  Final Clinical Impressions(s) / UC Diagnoses   Final diagnoses:  Acute cough  Acute bacterial sinusitis     Discharge Instructions      Xray does not show any pneumonia.  However with your duration of symptoms I would like to start you on an antibiotic. Please take the Augmentin twice daily for 7 days. Take with food to avoid upset stomach. This will treat sinus infection as well.    The promethazine DM cough syrup can be used once before bed. Be cautious as this might make you drowsy.   Albuterol inhaler 2-3 times daily for several days.  Please go to the emergency department if symptoms worsen.     ED Prescriptions     Medication Sig Dispense Auth. Provider   albuterol (VENTOLIN HFA) 108 (90 Base) MCG/ACT inhaler Inhale 2 puffs into the lungs every 6 (six) hours as needed for wheezing or shortness of breath. 8 g Manhattan Mccuen, PA-C   promethazine-dextromethorphan (PROMETHAZINE-DM) 6.25-15 MG/5ML syrup Take 5 mLs by mouth at bedtime as needed for cough.  180 mL Mackinze Criado, PA-C   amoxicillin-clavulanate (AUGMENTIN) 875-125 MG tablet Take 1 tablet by mouth 2 (two) times daily for 7 days. 14 tablet Zarielle Cea, Asberry, PA-C      PDMP not reviewed this encounter.   Jeryl Asberry, [place] 11/28/23 8190

## 2023-11-28 NOTE — ED Notes (Addendum)
 Pt states she is feeling better following breathing treatment. Asberry PA made aware.

## 2023-11-28 NOTE — ED Triage Notes (Signed)
 Pt presents with complaints cough, nasal congestion, wheezing, headaches, and shortness of breath x 8 days. Has been taking OTC decongestant, cold & congestion, and Tylenol  with little improvement. Denies fevers at home. 96% on room air. Appears in no distress. Denies sick contacts. No pain. Only voices discomfort in throat from coughing up phlegm.

## 2023-12-09 ENCOUNTER — Telehealth: Payer: Self-pay | Admitting: Family Medicine

## 2023-12-09 ENCOUNTER — Telehealth: Payer: Self-pay | Admitting: Pharmacist

## 2023-12-09 NOTE — Telephone Encounter (Addendum)
 Patient contacted for adherence QI for Ozempic  (semaglutide ).  Since last contact patient reports that she is currently on 0.5 mg weekly and that she is planning to pick up new supply next week.  Patient reports that she is feeling better, not having nocturia anymore but is not checking glucose at home.   Apt scheduled with PCP on 01/18/24  Total time with patient call and documentation of interaction: 8 minutes.  This patient is appearing on a report for being at risk of failing the adherence measure for cholesterol (statin) medications this calendar year.   Medication: atorvastatin  40 mg  Last fill date: 11/28/2023 for 90 day supply  Chart review completed

## 2023-12-09 NOTE — Telephone Encounter (Signed)
 Reviewed and agree with Dr Rennis plan.

## 2023-12-09 NOTE — Telephone Encounter (Signed)
**  After Hours/ Emergency Line Call**  Received a page to call [phone]) - [phone].  Patient: Regina Burns  Caller: Self  Confirmed name & DOB of patient with caller  Subjective:   Patient calling to confirm her ozempic  dosing Per Dr Rennis note today she has been on 0.5 weekly She reports she is taking this dose and has been on it for at least 3 weeks I advised she can continue this dose and f/u with PCP as scheduled in December No further questions  -- Red flags discussed.   -- Will forward to PCP.  Payton Coward, MD [facility], PGY-3

## 2023-12-30 ENCOUNTER — Other Ambulatory Visit: Payer: Self-pay

## 2023-12-30 DIAGNOSIS — E119 Type 2 diabetes mellitus without complications: Secondary | ICD-10-CM

## 2024-01-03 NOTE — Telephone Encounter (Signed)
 Patient has been taking the 0.5 mg injections for at least 4 weeks now. Unclear how much weight loss she is having as she has not been able to check. Will refill her 0.5 mg weekly dose for now and assess clinical response and need for dose increase at PCP appointment in two weeks.

## 2024-01-18 ENCOUNTER — Ambulatory Visit

## 2024-01-23 ENCOUNTER — Ambulatory Visit (INDEPENDENT_AMBULATORY_CARE_PROVIDER_SITE_OTHER)

## 2024-01-23 VITALS — BP 131/77 | HR 75 | Ht 68.0 in | Wt 332.4 lb

## 2024-01-23 DIAGNOSIS — E119 Type 2 diabetes mellitus without complications: Secondary | ICD-10-CM | POA: Diagnosis not present

## 2024-01-23 DIAGNOSIS — H539 Unspecified visual disturbance: Secondary | ICD-10-CM | POA: Diagnosis not present

## 2024-01-23 DIAGNOSIS — E782 Mixed hyperlipidemia: Secondary | ICD-10-CM

## 2024-01-23 DIAGNOSIS — Z23 Encounter for immunization: Secondary | ICD-10-CM

## 2024-01-23 DIAGNOSIS — I10 Essential (primary) hypertension: Secondary | ICD-10-CM | POA: Diagnosis not present

## 2024-01-23 LAB — POCT GLYCOSYLATED HEMOGLOBIN (HGB A1C): HbA1c, POC (controlled diabetic range): 6.2 % (ref 0.0–7.0)

## 2024-01-23 MED ORDER — SEMAGLUTIDE (1 MG/DOSE) 4 MG/3ML ~~LOC~~ SOPN: 1.0000 mg | PEN_INJECTOR | SUBCUTANEOUS | 2 refills | Status: DC

## 2024-01-23 MED ORDER — GABAPENTIN 600 MG PO TABS: 600.0000 mg | ORAL_TABLET | Freq: Every evening | ORAL | 3 refills | Status: AC

## 2024-01-23 NOTE — Assessment & Plan Note (Signed)
 Taking atorvastatin  40 mg daily. Will obtain direct LDL today and increase statin if indicated.

## 2024-01-23 NOTE — Progress Notes (Signed)
" ° ° °  SUBJECTIVE:   CHIEF COMPLAINT / HPI:   DM Taking ozempic  0.5 Mg qweekly Weight 332 lbs today, 351 lbs 09/01/23, 340 lbs 11/28/23 19 lbs weight loss over 5 months  Hypertension Has had some vision changes the past couple months, following with an ophthalmologist Compliant with chlorthalidone  and carvedilol . Is not been able to check her blood pressure at home.  PERTINENT  PMH / PSH: Diabetes, obesity, hypertension, hyperlipidemia  OBJECTIVE:   BP 131/77   Pulse 75   Ht 5' 8 (1.727 m)   Wt (!) 332 lb 6.4 oz (150.8 kg)   SpO2 96%   BMI 50.54 kg/m    General: Well-appearing female in no acute acute distress CV: RRR, no murmurs, 2+ radial pulse Pulm: CTAB, no increased work of breathing Abdomen: Obese, soft, nontender  ASSESSMENT/PLAN:   Assessment & Plan Diabetes mellitus without complication (HCC) Morbid obesity (HCC) A1c 6.2 today.   19 pound weight loss since August.  Will increase Ozempic  to 1 mg weekly for 4 weeks and instructed patient to assess weight loss goal of 1 to 2 pounds per week.  She will call back to increase dose if not at weight goal. Mixed hyperlipidemia Taking atorvastatin  40 mg daily. Will obtain direct LDL today and increase statin if indicated. Encounter for immunization Flu shot given today. Essential hypertension, benign BP at goal, goal <130/80.  Continue chlothalisone and carvedilol  Vision changes In the last couple months, she has developed what she describes as a dark shadow within her visual field when looking with only her right eye.  She is following with an ophthalmologist for this suspects this could be due to her cataracts or her diabetes. He has also ordered a head CT, per patient report. Given the subacute nature of her vision changes and ophthalmologic insight, we discussed that she can continue her Ozempic  at this current time. We did discuss the small risk for nonarteritic anterior ischemic optic neuropathy with Ozempic ;  thoguh, this does not seem contributory here.   Patient will follow-up with me in 3 months and call back sooner if needed.  Regina Burns Regina Morrison, MD [facility] "

## 2024-01-23 NOTE — Patient Instructions (Addendum)
 For your diabetes and obesity, increase Ozempic  to 1 mg every week for 4 weeks.  If you are not losing 1 to 2 pounds per week at this dose, call our office and we can increase the dose after 4 weeks.  You can do 2 injections of the 0.5 mg doses once a week until that dose runs out.  Your recheck blood pressure is around goal today (less than 130/80), so continue your current chlorthalidone  and carvedilol .  Or your neuropathy, try 600 mg of gabapentin  at night.  If you feel groggy, disoriented, sedated, or have other side effects when you wake up in the morning, you can try 300 milligrams in the afternoon and at night.  We discussed the risk of falls and other side effects with this medication.  Please call back if you have any side effect concerns.  Continue your atorvastatin .  I will recheck a cholesterol level today, and it if it is elevated we can increase the dose of your atorvastatin .

## 2024-01-23 NOTE — Assessment & Plan Note (Signed)
 BP at goal, goal <130/80.  Continue chlothalisone and carvedilol 

## 2024-01-24 ENCOUNTER — Ambulatory Visit: Payer: Self-pay

## 2024-01-24 LAB — LDL CHOLESTEROL, DIRECT: LDL Direct: 109 mg/dL — ABNORMAL HIGH (ref 0–99)

## 2024-01-24 MED ORDER — ATORVASTATIN CALCIUM 80 MG PO TABS: 80.0000 mg | ORAL_TABLET | Freq: Every day | ORAL | 3 refills | Status: AC

## 2024-01-26 ENCOUNTER — Other Ambulatory Visit (HOSPITAL_COMMUNITY): Payer: Self-pay

## 2024-01-26 ENCOUNTER — Telehealth: Payer: Self-pay

## 2024-01-26 NOTE — Telephone Encounter (Signed)
 Pharmacy Patient Advocate Encounter  Received notification from [employer] that Prior Authorization for OZEMPIC  1MG  has been APPROVED from 01/19/24 to 01/25/25   PA #/Case ID/Reference #: [id number]

## 2024-01-26 NOTE — Telephone Encounter (Signed)
 Patient calls nurse line in regards to Ozempic .   She reports she switched insurances at the beginning of the year.   She reports she now has [facility].   She reports she needs a prior auth.   Advised will forward to pharmacy team for assistance.

## 2024-01-26 NOTE — Telephone Encounter (Signed)
 Called pharmacy.   Medication has been approved, however the copay is ~300 dollars.   Spoke with patient. She reports she can not afford this at this time.   She is requesting an alternative oral medication.   Advised will forward to PCP and pharmacy for possible assistance.

## 2024-01-26 NOTE — Telephone Encounter (Signed)
 Pharmacy Patient Advocate Encounter   Received notification from Pt Calls Messages that prior authorization for OZEMPIC  1MG  is required/requested.   Insurance verification completed.   The patient is insured through [employer].   PA required; PA submitted to above mentioned insurance via Latent Key/confirmation #/EOC [id number]. Status is pending

## 2024-01-27 ENCOUNTER — Other Ambulatory Visit (HOSPITAL_COMMUNITY): Payer: Self-pay

## 2024-01-27 MED ORDER — SEMAGLUTIDE(0.25 OR 0.5MG/DOS) 2 MG/3ML ~~LOC~~ SOPN: PEN_INJECTOR | SUBCUTANEOUS | 3 refills | Status: DC

## 2024-01-27 NOTE — Telephone Encounter (Signed)
 Contacted pharmacy  Copay of Ozempic  $342 for 28 day supply - coupon would only take $100 off, making copay $242.   Didn't call in card.

## 2024-01-30 MED ORDER — SEMAGLUTIDE(0.25 OR 0.5MG/DOS) 2 MG/3ML ~~LOC~~ SOPN: 0.5000 mg | PEN_INJECTOR | SUBCUTANEOUS | 3 refills | Status: DC

## 2024-01-30 NOTE — Telephone Encounter (Signed)
 Patient calls nurse line in response to fpl group.   She reports she would like to do the 0.5mg  at 47 dollars per month.   Advised will forward to PCP.

## 2024-02-06 ENCOUNTER — Other Ambulatory Visit (HOSPITAL_COMMUNITY): Payer: Self-pay

## 2024-02-06 NOTE — Telephone Encounter (Signed)
 Patient returns call to nurse line. She states that she spoke with pharmacist who advised her that she actually would not be able to get the 0.5 mg for $47.   She is asking for prescription of 1 mg pen since she is going to have to pay out of pocket anyway for her deductible.   Forwarding request to PCP.   Chiquita JAYSON English, RN

## 2024-02-07 ENCOUNTER — Other Ambulatory Visit (HOSPITAL_COMMUNITY): Payer: Self-pay

## 2024-02-07 ENCOUNTER — Other Ambulatory Visit: Payer: Self-pay

## 2024-02-07 DIAGNOSIS — E119 Type 2 diabetes mellitus without complications: Secondary | ICD-10-CM | POA: Insufficient documentation

## 2024-02-07 MED ORDER — SEMAGLUTIDE (1 MG/DOSE) 4 MG/3ML ~~LOC~~ SOPN: 1.0000 mg | PEN_INJECTOR | SUBCUTANEOUS | 3 refills | Status: AC

## 2024-02-21 ENCOUNTER — Telehealth: Payer: Self-pay

## 2024-02-21 NOTE — Telephone Encounter (Signed)
 Pt has been scheduled.

## 2024-02-21 NOTE — Telephone Encounter (Signed)
 Copied from CRM #[id number]. Topic: Clinical - Order For Equipment >> Feb 20, 2024  9:38 AM Regina Burns wrote: Reason for CRM: pt called requesting to get back on her CPAP machine and wnts to know wht she needs to do. Stated she stopped using her CPAP machine in June 2025. Stated she purchased the machine herself not through insurance.   Please schedule pt ov with Dr. Neda to discuss cpap.  Thanks!

## 2024-02-22 ENCOUNTER — Ambulatory Visit: Admitting: Pulmonary Disease

## 2024-02-22 ENCOUNTER — Encounter: Payer: Self-pay | Admitting: Pulmonary Disease

## 2024-02-22 VITALS — BP 132/81 | HR 78 | Ht 67.0 in | Wt 326.1 lb

## 2024-02-22 DIAGNOSIS — G4733 Obstructive sleep apnea (adult) (pediatric): Secondary | ICD-10-CM

## 2024-02-22 NOTE — Progress Notes (Signed)
 "  Subjective:    Patient ID: Regina Burns, female    DOB: 1955-06-20, 69 y.o.   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  She felt good at the time She felt she was benefiting from CPAP but felt she did not need it anymore at that time  She has since started having more daytime sleepiness and fatigue, waking up feeling unrested  Did not have significant problems tolerating CPAP previously and was using it regularly and benefiting from it  She has lost some weight since her last visit She is on Ozempic   She is trying to exercise more  At the last time she was here CPAP was decreased from 14-12 She was benefiting from CPAP for she finally stopped it  Usually goes to bed about 12-1, final wake up time between 8 and 9 AM  She still has a machine, but does not have any supplies She is committed to using it on a regular basis - Was benefiting from it when she was using it regularly  She is a reformed smoker  Used to drive a schoolbus  No history of heart disease   Past Medical History:  Diagnosis Date   GERD (gastroesophageal reflux disease) 2005   Hyperlipidemia 2012   Hypertension 2012   Social History   Tobacco Use   Smoking status: Former    Current packs/day: 0.00    Types: Cigarettes    Quit date: 01/18/1998    Years since quitting: 26.1    Passive exposure: Past   Smokeless tobacco: Never  Vaping Use   Vaping status: Never Used  Substance Use Topics   Alcohol use: No   Drug use: No    Frequency: 3.0 times per week   Family History  Problem Relation Age of Onset   Alcohol abuse Mother    Alzheimer's disease Mother    Heart disease Mother    Diabetes Mother    Hypertension Mother    Stroke Mother    Heart disease Father    Diabetes  Father    Hyperlipidemia Father    Hypertension Father    Alzheimer's disease Maternal Aunt    Hypertension Paternal Aunt    Hypertension Paternal Uncle    Hypertension Maternal Grandmother    Breast cancer Neg Hx    Review of Systems  Constitutional:  Positive for fatigue.  HENT: Negative.    Eyes: Negative.   Respiratory:  Positive for apnea.   Cardiovascular: Negative.   Gastrointestinal: Negative.   Psychiatric/Behavioral:  Positive for sleep disturbance.    Vitals:   02/22/24 1304  BP: 132/81  Pulse: 78  SpO2: 96%      Objective:   Physical Exam Constitutional:      General: She is not in acute distress.    Appearance: She is well-developed. She is not diaphoretic.  HENT:     Head: Normocephalic and atraumatic.     Nose: Nose normal.     Mouth/Throat:     Mouth: Mucous membranes are moist.  Eyes:     General:        Right eye: No discharge.        Left eye: No discharge.     Pupils: Pupils are equal, round, and reactive to light.  Neck:  Trachea: No tracheal deviation.  Cardiovascular:     Rate and Rhythm: Normal rate and regular rhythm.     Heart sounds: No murmur heard. Pulmonary:     Effort: Pulmonary effort is normal. No respiratory distress.     Breath sounds: Normal breath sounds. No stridor. No wheezing, rhonchi or rales.  Musculoskeletal:     Cervical back: No rigidity or tenderness.  Skin:    General: Skin is warm.  Neurological:     General: No focal deficit present.     Mental Status: She is alert.  Psychiatric:        Mood and Affect: Mood normal.     Previous study reviewed which was in 2019 showing moderate obstructive sleep apnea  No recent compliance  Compliance at last use visit was 87% compliance on a CPAP of 14 with AHI of 2.8    Assessment & Plan:   Moderate obstructive sleep apnea - Got away from using CPAP for a while - She is ready get back to using it as she is having recurrence of her symptoms  Class III  obesity - On Ozempic  and tolerating it well with some weight loss  No underlying lung disease No underlying heart disease  Increasing fatigue and sleepiness   Plan. She will get back to using CPAP  Will contact DME company with order for CPAP supplies  I did let her know that she may need to have a repeat sleep study if were not able to get supplies  Encouraged to continue weight loss efforts  Follow-up in about 2 months  I personally spent a total of 30 minutes in the care of the patient today including preparing to see the patient, getting/reviewing separately obtained history, performing a medically appropriate exam/evaluation, counseling and educating, placing orders, documenting clinical information in the EHR, and independently interpreting results.   "

## 2024-02-22 NOTE — Patient Instructions (Signed)
 Placed order for CPAP supplies to go to [facility]  Tentative follow-up in about 2 months  If were not able to get supplies, it may mean that we need to repeat your sleep study to confirm he still have sleep disordered breathing-this is usually just an insurance thing  Continue weight loss efforts  Call us  with significant concerns

## 2024-05-28 ENCOUNTER — Ambulatory Visit: Admitting: Pulmonary Disease
